# Patient Record
Sex: Female | Born: 1985 | Race: White | Hispanic: No | Marital: Single | Smoking: Former smoker
Health system: Southern US, Community
[De-identification: ages and names within clinical notes are randomized; demographics above are authoritative.]

## PROBLEM LIST (undated history)

## (undated) DIAGNOSIS — F319 Bipolar disorder, unspecified: Secondary | ICD-10-CM

## (undated) DIAGNOSIS — J45909 Unspecified asthma, uncomplicated: Secondary | ICD-10-CM

## (undated) DIAGNOSIS — J302 Other seasonal allergic rhinitis: Secondary | ICD-10-CM

## (undated) DIAGNOSIS — E282 Polycystic ovarian syndrome: Secondary | ICD-10-CM

## (undated) HISTORY — PX: WISDOM TOOTH EXTRACTION: SHX21

## (undated) HISTORY — DX: Polycystic ovarian syndrome: E28.2

---
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
## 2008-10-05 ENCOUNTER — Other Ambulatory Visit: Admission: RE | Admit: 2008-10-05 | Discharge: 2008-10-05 | Payer: Self-pay | Admitting: Family Medicine

## 2009-03-26 ENCOUNTER — Emergency Department (HOSPITAL_BASED_OUTPATIENT_CLINIC_OR_DEPARTMENT_OTHER): Admission: EM | Admit: 2009-03-26 | Discharge: 2009-03-26 | Payer: Self-pay | Admitting: Emergency Medicine

## 2009-03-26 ENCOUNTER — Ambulatory Visit: Payer: Self-pay | Admitting: Diagnostic Radiology

## 2009-06-07 ENCOUNTER — Emergency Department (HOSPITAL_COMMUNITY): Admission: EM | Admit: 2009-06-07 | Discharge: 2009-06-07 | Payer: Self-pay | Admitting: Emergency Medicine

## 2009-10-16 ENCOUNTER — Other Ambulatory Visit: Admission: RE | Admit: 2009-10-16 | Discharge: 2009-10-16 | Payer: Self-pay | Admitting: Family Medicine

## 2009-12-24 ENCOUNTER — Emergency Department (HOSPITAL_BASED_OUTPATIENT_CLINIC_OR_DEPARTMENT_OTHER): Admission: EM | Admit: 2009-12-24 | Discharge: 2009-12-24 | Payer: Self-pay | Admitting: Emergency Medicine

## 2010-11-15 ENCOUNTER — Other Ambulatory Visit
Admission: RE | Admit: 2010-11-15 | Discharge: 2010-11-15 | Payer: Self-pay | Source: Home / Self Care | Admitting: Family Medicine

## 2010-11-15 ENCOUNTER — Other Ambulatory Visit: Payer: Self-pay | Admitting: Family Medicine

## 2011-02-09 LAB — POCT PREGNANCY, URINE: Preg Test, Ur: NEGATIVE

## 2011-02-11 LAB — DIFFERENTIAL
Basophils Absolute: 0.1 10*3/uL (ref 0.0–0.1)
Basophils Relative: 2 % — ABNORMAL HIGH (ref 0–1)
Neutro Abs: 4.5 10*3/uL (ref 1.7–7.7)
Neutrophils Relative %: 71 % (ref 43–77)

## 2011-02-11 LAB — BASIC METABOLIC PANEL
CO2: 24 mEq/L (ref 19–32)
Calcium: 9.3 mg/dL (ref 8.4–10.5)
Creatinine, Ser: 0.7 mg/dL (ref 0.4–1.2)
GFR calc Af Amer: >60 mL/min (ref >60)
Glucose, Bld: 91 mg/dL (ref 70–99)

## 2011-02-11 LAB — D-DIMER, QUANTITATIVE: D-Dimer, Quant: 0.26 ug/mL-FEU (ref 0.00–0.48)

## 2011-02-11 LAB — CBC
MCHC: 33.8 g/dL (ref 30.0–36.0)
RDW: 11.9 % (ref 11.5–15.5)

## 2011-03-21 NOTE — Discharge Summary (Signed)
Summer Arnold, Summer Arnold                 ACCOUNT NO.:  [id number]   MEDICAL RECORD NO.:  [id number]          PATIENT TYPE:  INP   LOCATION:  0104                          FACILITY:  [facility]   PHYSICIAN:  Lalla Brothers, MDDATE OF BIRTH:  1986-07-15   DATE OF ADMISSION:  04/27/2005  DATE OF DISCHARGE:  05/02/2005                                 DISCHARGE SUMMARY   IDENTIFICATION:  A 25 year old female who will be a senior at [employer] this fall was admitted emergently voluntarily in transfer from  [facility] for acute inpatient psychiatric  stabilization and treatment of suicide intent, planning to overdose to kill  herself this time. She had previous suicide attempts as well as superficial  lacerations self-inflicted on the right wrist. She had been noncompliant  with the medication at least 4 days. She was planning to be impregnated by a  23 year old man she states on the Internet by meeting him April 29, 2005 for  that purpose. Although she is known to have bipolar disorder since as early  as age 9 requiring inpatient treatment for overdose at that time, she is  depressed at the time of admission. The patient feels hopeless about herself  and her future and seems to design her pregnancy more to fulfill emptiness  and failure of her own rather than out of grandiose or overly romantic  reasons. The patient has never opened up about these concerns on previous  admissions which have been numerous, with mother establishing case  management during her last hospitalization here of preferring return to  acute treatment instead of moving onto chronic treatment at the time of  referral. For full details, please see the typed admission assessment.   SYNOPSIS OF PRESENT ILLNESS:  The patient's case manager is Reatha Armour with  [employer], [phone]. She had inpatient treatment in 1999 at  age 43 for an overdose. In December2001, she was inpatient  at the [facility] for suicidal ideation and depression. She was hospitalized in  September2005, being clinically manic at that time but reporting depression  with suicidal ideation and self cutting. Her last admission was February 05, 2005 through February 13, 2005 at which time Topamax was increased and Abilify  was changed to Geodon. The patient and mother indicate that the patient has  been on the increased dose of Lamictal at150 mg daily since last  hospitalization in April of 2006. Mother was the patient off so many  medications and fears that interactions are the source of the patient's  decompensation. The patient generally maintains it is just a need for  medication adjustment while the patient seems to genuinely have  developmental and dynamic relationship concerns that she is never discussing  and never resolving. She has been involved in Internet pornography as well  as planning dangerous liaisons with convicted criminals and sex offenders by  Internet. Family has intervened twice to protect her, once planning to elope  from school and another time climbing out of the second story window of  their home. She is  under the ongoing outpatient care of Dr. Toni Arthurs and has  seen Jerral Bonito for therapy in the past. The patient is currently resistant  to talking through solutions. Her Lithobid is 1,200 mg daily in two divided  doses, Topamax 100 mg every morning, Seroquel 400 mg daily in two divided  doses, Geodon 80 mg at bedtime, Lamictal 100 mg morning and 50 mg at night,  Ambien 5 mg at bedtime if needed along with Singulair 10 mg at bedtime,  Zyrtec 10 mg bedtime, and albuterol inhaler p.r.n. Aunt and maternal great  aunt had bipolar disorder. Her weight is the same as April2006 and down from  154 pounds in September2005. She did pass the 11th grade.   MENTAL STATUS EXAM:  On admission, the patient had a paucity of spontaneous  verbal communication, particularly about  important issues and conflicts. She  was devaluing of treatment with severe dysphoria. She was withdrawn and  crying frequently. She had impulse control difficulties. She was less  passive aggressive and had enhanced maturation of identity character  structure though she did not always apply this. The patient reported that  mother aborted her suicide attempt plans and that she would have killed  herself by overdose if not for mother's intervention.   LABORATORY FINDINGS:  The patient had admission comprehensive metabolic  panel was normal with sodium 140, potassium 3.8, random glucose 89, chloride  105, CO2 28, creatinine 0.8, calcium 9.2, albumin 3.7, AST 16, ALT 15. TSH  was normal 1.710. Urine HCG was negative. Urine drug screen was negative  with creatinine of 108 mg/dL. Urinalysis was normal with specific gravity of  1.018 with a trace of protein and negative microscopic exam with a few  epithelial cells. Urine probe for gonorrhea and chlamydia trichomatous by  DNA amplification were both negative. Lithium level at the time of admission  was 0.72 mEq/L with reference range 0.8-1.4. Three days later, on the same  dose of lithium though she had been noncompliant prior to admission, the  lithium level was to 1.23 mEq/L with reference range 0.8-1.4, likely  approaching steady state then.   HOSPITAL COURSE AND TREATMENT:  General medical exam by Mallie Darting, P.A.-  C., noted the patient's perception that stepfather interrupted her suicide  plan. The patient notes that biological father might have depression. The  patient reports feeling unloved and thinking that her boyfriend will not  come to see her. She reports probable ALLERGY TO PENICILLIN. She reports  history of asthma and migraine. She has a birthmark and that is benign on  the left great toe. She had precocious menarche at age 89-1/2 and reports  that she is sexually active. She is overweight and is greatly concerned about her  weight. She is Tanner stage V and notes GYN exam last summer at  [facility] [place]. Admission height was 61 inches with  weight of 148 pounds. Blood pressure on admission was 127/90 with heart rate  of 90 sitting and 121/81 with heart rate of 95 standing. Vital signs were  normal throughout hospital stay and discharge blood pressure was 118/79 with  heart rate of 79 supine and 102/77 with heart rate of 86 standing. The  patient was resistant to therapeutic change but did comply although she  became angry and self-defeating when Topamax was completely discontinued.  Geodon was discontinued, and Ambien was made as needed with the patient  requesting it approximately 70% of the time. Lamictal was increased to 100  mg morning and bedtime which the patient discounted, stating he was making  her too sleepy though primarily she complained that the medications were  slower her down because she had restarted them after being off of them for  four days. Topamax was tapered and discontinued but restarted when she  complained bitterly that she would gain weight with her vegetative symptoms  if Topamax was not continued. She did have several migraines during the  hospital stay treated with Midrin successfully. However, she did not have  frequent migraines. Mother and the patient gradually restored communication  with the patient addressing during this admission better than in any time in  the past the dynamics of her sense of being unloved and having the need for  a relationship even with undesirable forbidden boyfriend. She initially was  angry and withdrawn as peers confronted her maladaptive choices and  behaviors, but she gradually integrated with peers in the treatment milieu  and began to look realistically at her behavior. As she did so, she and  mother improved communication and establishing safe and collaborative  directions for future. Mother was considering Depo-Provera in  the final  family therapy session. The patient had tried living with father two years  ago without success. The family reconstituted capacity to continue work on  containment with hopefully the greatest improvement coming internally for  the patient. She was discharged in improved condition. She required no  seclusion or restraint during hospital stay. She was labile during hospital  stay, but gradually her mood improved as did impulse control, and she became  more realistic in her treatment discussions so that she could benefit from  psychotherapy after discharge.   FINAL DIAGNOSIS:  AXIS I:  1.  Bipolar disorder, depressed, severe.  2.  Oppositional defiant disorder.  3.  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  Noncompliance with medication and therapy.  5.  Parent/child problem.  6.  Other specified family circumstances.  7.  Other interpersonal problem.  AXIS II:  Diagnosis deferred.  AXIS III:  1.  Lacerations. 2.  Allergic rhinitis and asthma.  3.  Noncompliance including with psychotropic medication and birth control      pill.  4.  Migraine.  5.  Myopia  6.  Overweight.  7.  Allergy to penicillin and possibly aspirin by history.  AXIS IV:  Stressors:  Family severe, acute and chronic; peer relations  severe, acute and chronic; Internet porn and chat rooms severe, acute and  chronic; phase of life severe, acute and chronic; school moderate, acute and  chronic.  AXIS V:  GAF on admission 34 with highest in last year 65 and discharge GAF  was 55.   PLAN:  The patient had more success during this hospitalization than any in  the past. Community-based aftercare was expanded upon as the patient becomes  more capable and willing to work on such. Will make Topamax p.r.n. for  vegetative impulse dyscontrol as well as making Ambien p.r.n. for sleep.  Geodon was discontinued. She is having no side effects from increased   Lamictal including skin being clear. Her lithium level became therapeutic  again inpatient. She is discharged on the following medications:  1.  Lamictal 100 mg tablet every morning and bedtime, quantity #60 with no      refill prescribed.  2.  Lithobid 300 mg tablets take 2 every morning and bedtime, quantity #120  with no refill prescribed.  3.  Seroquel 200 mg tablet every morning and 5:00 p.m., quantity #60 with no      refill prescribed.  4.  Topamax 100 mg tablet as 1/2 or 1 in the morning if needed for      vegetative impulse dyscontrol, quantity #30 with no refill prescribed.  5.  Ambien 5 mg at bedtime if needed for insomnia, quantity #30 with no      refill prescribed.  6.  Inderal 10 mg every morning and bedtime, own home supply.  7.  Zyrtec 10 mg every bedtime, own home supply.  8.  Singulair 10 mg every bedtime, own home supply.  9.  Ortho-Tri-Cyclen every bedtime, as per own home supply, with mother      planning switched to Depo-Provera.  10. Albuterol inhaler as needed, as per own home supply.   The patient follows a weight reduction, weight control diet is and is  encouraged be physically active including exercise. Crisis and safety plans  are outlined if needed. Parents wish to set up aftercare psychotherapy with  Dr. Carlus Pavlov and psychiatric follow-up with Dr. Len Blalock  themselves to coordinate times. They have  information on bipolar support groups in the community, and [place] and  [facility], and the [facility], teen mental  health issues group, scheduled the first and third Mondays at 1800 for an  hour and half at [facility].       GEJ/MEDQ  D:  05/05/2005  T:  05/06/2005  Job:  [id number]   cc:   Onalee Hua L. Toni Arthurs, M.D.  [address]  [address]  [place]  [place] [address]  Fax: [phone]   Carlus Pavlov, M.D.  [address] [place], [place] [address]
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
## 2011-03-21 NOTE — H&P (Signed)
NAMEMarland Kitchen  Summer Arnold, Summer Arnold                             ACCOUNT NO.:  [id number]   MEDICAL RECORD NO.:  [id number]                   PATIENT TYPE:  INP   LOCATION:  0105                                 FACILITY:  [facility]   PHYSICIAN:  Beverly Milch, MD                  DATE OF BIRTH:  1986-02-29   DATE OF ADMISSION:  07/11/2004  DATE OF DISCHARGE:                         PSYCHIATRIC ADMISSION ASSESSMENT   ADOLESCENT PSYCHIATRIC ADMISSION ASSESSMENT   IDENTIFICATION:  3-1/2-year-old female, 11th grade student at [employer], is admitted emergently voluntarily on referral from Dr. Len Blalock for inpatient stabilization of suicide attempt and exacerbation of  bipolar disorder and impulse control disorder.  The patient has been less  stable in her mood as the stress of starting school has occurred.  She feels  more tension and pressure to be on the Internet with a 3 year old ex-  convict and mother has discovered a plan for the conviction to pick the  patient up at the end of the school day 07/12/2004.  As mother confronted  the patient, she decompensated into despair and cut her left forearm  multiple times with glass.  She was hospitalized at the [facility] in 2001 for a Tylenol overdose suicide attempt.  For full details,  please see the typed admission assessment.   SYNOPSIS OF PRESENT ILLNESS:  The patient has a long history of bipolar  disorder and was first hospitalized in 1999, apparently at [facility].  The patient has been under the care of Dr. Len Blalock and  notes that she complies with her medication.  She states her lithium level  was therapeutic earlier this year.  She states she will always be on  Seroquel and she was on Seroquel during the last [facility] 12/01 through 10/07/2000.  At that time, she was also on Luvox and  Depakote.  The patient states she can never take Depakote because it almost  killed her  by becoming so unstable in her mood and states she knows she  cannot tolerate any antidepressants with her bipolar disorder.  Rapid  cycling was noted during that hospitalization in 2001, though she was  admitted more depressed at that time.  The patient states she is depressed  at this time of admission, though nursing observes her to be more manically  excited.  Patient truly seems to have rapid cycling at times.  Patient is  still fixated on being able to fulfill the relationship with the man on the  Internet.  She states Dr. Toni Arthurs and she have talked multiple times about  her obsessiveness about the computer.  This was noted in her chart in 2001  that she had some obsessive-compulsive concerns.  The patient does not  manifest anxiety in this regard, but rather impulse control difficulties.  She states that she develops an internal tension  and pressure to carry out  risk taking things and it becomes unbearable if she does not follow through.  She uses alcohol occasionally, but is not using alcohol or illicit drugs in  any out-of-control fashion even though there is a family history of  alcoholism in the males on both sides of the family.  There is a family  history of bipolar disorder, as well as apparently unipolar depression.  Patient notes no hyperpnea or temperature regulation difficulties.  She  denies purging.  She is somewhat overweight, but denies body image  distortion.  She denies compulsive exercising, diet pills, laxatives, but  she notes that she does drink a lot of water, though she quantifies this as  2 water bottles daily.  She does bite her nails and peels her nail folds by  picking.  She will not discuss other impulse control difficulties and seems  to hesitate to discuss eating behavioral issues.  She denies hallucinations  or delusions at the time of admission.  She has had no intercurrent organic  central nervous system trauma.  She continues to work with Dr. Len Blalock  regularly in her outpatient care.  Hospital records from her first  hospitalization at the [facility], which was either 1999 or  2000, were not immediately available.   PAST MEDICAL HISTORY:  The patient has a history of asthma and migraine,  currently managed by Dr. Sigmund Hazel at [facility] in [place].  She does have orthodontics.  Last menses was August of 2005 and  she is sexually active.  She had menarche around age 74 and menses are  regular.  She has acute lacerations on the left forearm from glass, which  were self-inflicted.  She has a birthmark on the left great toe.  She is  allergic to PENICILLIN and ASPIRIN, manifested by rash.  She has had no  seizure or syncope.  She has had no heart murmur or arrhythmia.  Her  medications at the time of admission include:  1.  Lithium carbonate 300 mg to take 2 every morning and 3 every 1630.  2.  Topamax 200 mg twice daily.  3.  Seroquel 200 mg three times daily.  4.  Zyrtec 10 mg every morning or Claritin 10 mg every morning.  5.  Singulair 10 mg every morning.  6.  Ortho Tri-Cyclen every morning, and she is on Topamax, which at high      doses may accelerate the metabolism of the BCP.  7.  Ventolin inhaler p.r.n. asthma.  8.  Midrin p.r.n. migraine.   REVIEW OF SYSTEMS:  The patient denies difficulty with gait, gaze, or  continence.  She denies exposure to communicable disease or toxins.  She  denies rash, jaundice, or purpura.  There is no chest pain, palpitations, or  presyncope.  There is no abdominal pain, nausea, vomiting, or diarrhea.  There is no dysuria or arthralgia.   IMMUNIZATIONS:  Up to date.   FAMILY HISTORY:  The patient lives with mother and stepfather.  Males on  both sides of the family have alcoholism.  There is bipolar disorder in an  aunt.  There are other aunts and a grandfather with depression.  SOCIAL AND DEVELOPMENTAL HISTORY:  The patient has had one episode of   shoplifting.  She has habitual nail biting and picking at the nail folds.  She appears to overeat somewhat.  She has used alcohol, but denies  consequences.  She is  sexually active and is currently obsessed with a female  on the Internet, who is an ex-convict.  The patient attends the 11th grade  at [employer].  The patient will not discuss other habits at this  time, if any.  She denies other drug use other than occasional alcohol.   ASSETS:  The patient seems capable for therapy, though she currently seems  to validate her approach to the computer, the Internet relationship, and the  problems with stress.   MENTAL STATUS EXAM:  Height is 61-1/2 inches and weight is 150 pounds.  Blood pressure is 124/89 with heart rate of 98 supine and standing blood  pressure is 115/85 with heart rate of 97.  Neurological exam is intact.  She  has no abnormal involuntary movements evident.  She has no soft neurologic  findings or pathologic reflexes.  Gait and gaze are intact.  Muscle strength  and tone are normal.  AMRs are normal.  She is alert and oriented with  speech intact.  Eosinophils are 7, slightly elevated with upper limit of  normal is 5, and potassium is slightly low at 3.4 with lower limit of normal  3.5.  Albumin is low at 3.2 with lower limit of normal 3.5.  Urine specific  gravity is 1.006 at midnight and, therefore, somewhat dilute, but performed  at midnight.  Lithium level is 1.3 the following morning with therapeutic  range 0.8.14.  This was performed at 0600 and likely approximately 13 hours  after dose.  She has subcuticular linear ecchymosis along the margin  vertically of the blood pressure cuff of the left arm with no other purpura  or free bleeding.  Patient is labile in mood.  She is somewhat over animated  and over activated, but intensely dysphoric with the situation of the  Internet relationship.  She is also stressed by the onset of school.  She  reports she is  depressed, but appears more manic.  She seems to have  definite rapid cycling.  She has impulse control difficulties with obsessive  and compulsive features.  She has no psychotic symptoms currently.  She has  no dissociative or posttraumatic symptoms.  She does not manifest adequate  anxiety.  She states she is risk taking and has impulse control  difficulties.  She has shoplifting once.  She has been suicidal, cutting her  left forearm and has a history of suicide attempts in the past.  She is not  homicidal or assaultive.   IMPRESSION:   AXIS I:  1.  Bipolar disorder, mixed, severe.  2.  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  3.  Rule out eating disorder not otherwise specified (provisional      diagnosis).  4.  Other interpersonal problem.  5.  Other specified family circumstances.   AXIS II:  Diagnosis deferred.   AXIS III:  1.  Asthma. 2.  Migraine.  3.  Orthodontics.  4.  Lacerations left forearm.  5.  Allergy to PENICILLIN and ASPIRIN.  6.  Slight eosinophilia.  7.  Low potassium and albumin possibly from polydipsia for water.  8.  Subcuticular contusion at the blood pressure site left arm.  9.  Oral contraceptives and Topamax.  10. Subcuticular ecchymosis at the blood pressure cuff site.   AXIS IV:  Stressors:  Internet severe, acute, and chronic; family moderate,  chronic; phase of life severe, acute; school moderate, acute, and chronic.  AXIS V:  GAF 38 with highest in the last year 65.   PLAN:  Patient is admitted for inpatient adolescent psychiatric and  multidisciplinary multimodal behavioral health treatment in a team-based  programmatic locked psychiatric unit.  Medications will be maintained and  will observe for impulse control difficulties and monitor mood as  psychotherapeutic interventions are advanced for safety as well.  Medication  adjustments can be undertaken as indicated, though currently  medication  regimen has been helpful on an outpatient basis, and the stress of the  Internet relationship, school, and the reaction of the family will be  addressed.  Cognitive behavioral therapy, family therapy, parent management  training, habit reversal, substance abuse prevention, social skills, and  identity consolidation can be addressed during the hospital stay.  Estimated  length of stay is 5-7 days with target symptoms for discharge being  stabilization of suicide risk and mood, stabilization of impulse control,  stress management, and generalization of the capacity for safe, effective  participation in outpatient treatment.                                               Beverly Milch, MD    GJ/MEDQ  D:  07/12/2004  T:  07/12/2004  Job:  [id number]

## 2011-03-21 NOTE — H&P (Signed)
[facility]  Patient:    Summer Arnold, Summer Arnold                          MRN: [id number] Adm. Date:  [id number] Attending:  Jasmine Pang CC:         Len Blalock, MD   Psychiatric Admission Assessment  DATE OF ADMISSION:  October 03, 2000.  PATIENT IDENTIFICATION:  A 25 year old Caucasian female from [place], [place] who is a Audiological scientist at [employer].  HISTORY OF PRESENT ILLNESS:  Patient has become increasingly depressed with multiple neurovegetative symptoms.  These include an increase in her appetite, anhedonia, feelings of hopelessness and worthlessness and low self esteem.  In addition, she has had suicidal ideation for the past two months.  She states this escalated to the point that she took an overdose of 30 Tylenol on the day of admission.  She admits she felt hopeless and wanted to die.  She was stabilized in the emergency room and then referred to our unit.  In addition to the depressive episode, she has rages at times, with extreme irritability.  PAST PSYCHIATRIC HISTORY:  Patient has been seen by Dr. Toni Arthurs who has diagnosed her with bipolar disorder not otherwise specified.  She is currently being treated with Depakote 250 mg p.o. b.i.d., Seroquel 50 mg p.o. t.i.d., Luvox 100 mg p.o. q.h.s.  SUBSTANCE ABUSE HISTORY:  None.  PAST MEDICAL HISTORY:  Patient has asthma.  Allergies:  Allergic to PENICILLIN (rash, shortness of breath).  She is also allergic to ASPIRIN.  Current medications as per past psychiatric history.  She is also on an albuterol inhaler for asthma.  FAMILY/SOCIAL HISTORY:  Patient lives with her mother, stepfather, and two siblings.  She spends weekends with her father.  She is in the 7th grade and well behaved in school.  There is no abuse history.  Family history is remarkable for both sides of the family having alcoholism.  Both sides of her family also have bipolar disorder and depression.  She  has no legal problems.  MENTAL STATUS EXAMINATION:  Patient presented as a friendly, cooperative Caucasian female with good eye contact.  Speech is normal rate and slow. There was some psychomotor retardation.  Mood was depressed.  Affect sad, constricted.  There was positive suicidal ideation as per history of present illness.  There was no homicidal ideation or aggression.  There was no psychosis or perceptual disturbance.  Thought processes were logical and goal directed.  Thought content revealed no predominant theme.  On cognitive exam, patient was alert and oriented to person, place and time and reason for being in the hospital.  Short term and long term memory were adequate.  General fund of knowledge were age and education level appropriate.  Attention and concentration were somewhat diminished as assessed by her difficulty concentrating.  Insight was fair, judgment poor.  ADMISSION DIAGNOSES: Axis I:    1, Bipolar disorder not otherwise specified.            2. Features of obsessive-compulsive disorder (according               to Dr. Toni Arthurs). Axis II:   Deferred. Axis III:  Asthma. Axis IV:   Severe. Axis V:    Global assessment of function is 10.  ASSETS AND STRENGTHS:  Patient is healthy.  She has a supportive family.  She is warm and engaging.  PROBLEMS:  Mood instability with  suicidal ideation.  SHORT TERM TREATMENT GOAL:  Resolution of suicidal ideation.  LONG TERM TREATMENT GOAL:  Resolution of mood instability.  INITIAL PLAN OF CARE:  Increase Seroquel to 100 mg p.o. b.i.d. with continued increases throughout hospital stay.  Increase Luvox to 150 mg p.o. q.h.s. We will get an a.m. Depakote level and most likely need to increase this to get to an appropriate therapeutic level.  Patient will also be involved in unit therapeutic groups and activities to decrease cognitive distortion and decrease suicidal ideation.  She will be involved in family  therapy.  ESTIMATED LENGTH OF STAY:  Five to seven days.  CONDITIONS NECESSARY FOR DISCHARGE:  No longer suicidal.  POST HOSPITAL CARE PLAN:  Patient will return home to live with family. Follow up therapy and medication management will be with Len Blalock, M.D. DD:  10/04/00 TD:  10/04/00 Job: [id number]

## 2011-03-21 NOTE — Discharge Summary (Signed)
[facility]  Patient:    Summer Arnold, Summer Arnold                          MRN: [id number] Adm. Date:  [id number] Disc. Date: 10/07/00 Attending:  Milford Cage H                           Discharge Summary  REASON FOR ADMISSION:  This 25 year old white female was admitted for increasing symptoms of depression and a drug overdose as a suicide attempt. For further history of present illness, please see the patients psychiatric admission assessment.  PHYSICAL EXAMINATION:  Her physical examination at the time of admission was remarkable for history of asthma.  LABORATORY EXAMINATION:  The patient underwent a laboratory work-up to rule out any medical problems contributing to her symptomatology.  A CBC with differential was significant for an eosinophil count of 15% and MCHC of 35.4, platelet count 144,000.  A metabolic panel was within normal limits.  Hepatic panel was within normal limits.  Thyroid function tests were within normal limits.  A valproic acid level on admission was 39.1.  GGT was unremarkable. A urine drug screen was unremarkable with the exception of an elevated acetaminophen level in the emergency room following the patients drug overdose where she was treated and stabilized and medically cleared. The UA was unremarkable.  Pregnancy test was negative.  The patient received no special procedures, no additional consultation.  She sustained no complications during her hospital course.  HOSPITAL COURSE:  The patient rapidly adapted to unit routine, socializing well with both patients and staff.  She was stabilized on Seroquel and Luvox. At the time of discharge the patient denies any suicidal or homicidal ideations.  She is participating in all aspects of the therapeutic treatment program.  It is felt that the patient has significant cycling in mood suggestive of bipolar disorder which currently appears to be fairly well controlled on a combination  of Seroquel and Luvox.  She has displayed no evidence of a danger to herself or others and consequently it is felt that she is ready for discharge in transition to a less restrictive alternative setting.  CONDITION ON DISCHARGE:  Improved.  DIAGNOSES ACCORDING TO DSM-4: AXIS I.   1. Bipolar disorder, mixed type, severe with mood congruent              psychosis.           2. Rule out obsessive compulsive disorder. AXIS II.  1. Rule out personality disorder not otherwise specified.           2. Rule out learning disorder not otherwise specified. AXIS III. Asthma. AXIS IV.  Severe. AXIS V.   Code 10 on admission and code 30 on discharge.  FURTHER EVALUATION AND TREATMENT RECOMMENDATIONS: 1. The patient is discharged to home. 2. The patient is discharged on an unrestricted level of activity and a    regular diet. 3. The patient is discharged on Seroquel 50 mg p.o. q.a.m. and 100 mg    q.h.s. Luvox 150 mg p.o. q.h.s. 4. The patient will follow up with her outpatient psychiatrist, Dr. Toni Arthurs,    and her individual and family therapist for all further aspects of her    mental health care.  As well, she will be referred to the IOP program    to attempt to transition her appropriately to a less restrictive  alternative setting prior to entrance back into standard outpatient    therapy.  Because of her problems with impulse control that necessitated    this admission, this appears to be indicated. The patient will follow up    with Dr. Carolanne Grumbling in the IOP program and consequently, I will sign off    on the case at this time. DD:  10/07/00 TD:  10/07/00 Job: [id number]

## 2011-03-21 NOTE — H&P (Signed)
NAMEMarland Arnold  Summer, Arnold                 ACCOUNT NO.:  [id number]   MEDICAL RECORD NO.:  [id number]          PATIENT TYPE:  INP   LOCATION:  0102                          [facility]ILITY:  [facility]   PHYSICIAN:  Lalla Brothers, MDDATE OF BIRTH:  08/01/1986   DATE OF ADMISSION:  02/05/2005  DATE OF DISCHARGE:                         PSYCHIATRIC ADMISSION ASSESSMENT   IDENTIFICATION:  This 25 year old female eleventh grade student at [employer] is admitted emergently involuntarily on a [facility]  petition for commitment in transfer from [facility]  Crisis for inpatient stabilization and treatment of suicide ideation,  planning to overdose her cut herself to die. The patient's symptoms of 3  day's duration exacerbated acutely when her attempt at eloping from home at  9:00 p.m. February 04, 2005 to join by cab the adult ex-convict to live with him  in [place] was aborted. The patient was caught as she was leaving the home  from the second story with a sheet injuring her left foot as she dropped to  the ground by bruising the bone. This adult female is the same female she was  leaving home to join when she was last hospitalized with mixed mania in  September 2005. She had recent medication changes attempting to the  structure and reduce her medications for optimizing alertness and minimizing  weight gain. Dr. Len Blalock, her outpatient psychiatrist, considered  admitting her to the hospital February 03, 2005 but the patient could  recompensate at that time. She is not currently able to contract for safety  or to resolve her current crisis and dilemma.   HISTORY OF PRESENT ILLNESS:  Mother indicates the patient's lithium has been  apparently reduced from 1200 to 900 milligrams daily in the interim since  September 2005 as she was doing better. She is also being followed by  nephrology for inability to adequately concentrate her urine during her  hospitalization of September  2005 and with borderline elevated blood  pressure. Nephrology follow-up and assessment has that has determine the  patient is safe taking lithium for now and has no evidence of lithium  associated nephropathy nor does she have other renal concentrating  difficulties. Her renal concentration returned to normal over time with no  other abnormality defined fine. Blood pressure remained borderline elevated  at times but did not require treatment. The patient has apparently been  started on Abilify in the interim but found Abilify makes her cognitively  constricted according to her outpatient therapist, Jerral Bonito, as well as  the patient complaining that it made her too sleepy. Mother states the  patient would complain of being sleepy even when she did not objectively  appear to be sleepy. Mother could not discern whether this was a therapy-  related issues or whether the patient was truly having side effects.  However, mother has gotten progressively stressed by the adjustments in the  patient's medication. Mother now feels she needs to go back to something  that has worked. Her lithium has been restored of 1200 milligrams recently.  She is on Seroquel only now and  mother acknowledges that Seroquel as been  helpful in the past but she states that Abilify was more helpful for mood  stabilization and organized thinking. The patient's Topamax has been  decreased from apparently 200 milligrams twice daily to 50 milligrams in the  morning. She has been started on Lamictal titrated up slowly to currently  100 milligrams every morning for the last week and she has another week to  go before that can be increased again. Mother has multiple ideas and fears  associated with adjusting her medication further. The patient was cutting a  couple of weeks ago and is now threatening to kill herself by cutting or by  overdose. She overdosed at age 87. The patient is at hospitalization  July 11, 2005 through  July 18, 2004 at the behavioral [facility] center  and prior to that in December 2001 and 1999 when this hospital was [facility]. Depakote was not successful in the past. She was treated with  Luvox in the past which ultimately was destabilizing and eventually the  diagnosis of bipolar disorder became clear. The patient acknowledges using  cannabis a couple of times but not consistently. She sees Dr. Len Blalock  regularly. However, the patient is currently irritable about any treatment.  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  The patient again has utilized computer inappropriately to arrange this  rendezvous.   PAST MEDICAL HISTORY:  The patient has a history of migraine, allergic  rhinitis and asthma, and chicken pox in childhood. She has ongoing  medications for such. Last menses was in March 2006 and she acknowledges  being sexually active. She is on birth control pills including Ortho Tri-  Cyclen every bedtime currently. She is on Singulair 10 milligrams every  morning, Zyrtec 10 milligrams every morning and albuterol inhaler p.r.n. as  well as Adderall 10 milligrams b.i.d.. Psychiatric medications currently  include Lithobid 600 milligrams b.i.d., Topamax 50 milligrams every morning,  Lamictal 100 milligrams every morning over the last week, and Seroquel 200  milligrams at breakfast and 4:00 p.m. and 100 milligrams at that time. The  patient has an acute contusions of the left foot. She feels that she landed on something too firm when she dropped to the ground from the  sheet coming  out of her second story window. Her nephrology follow-up has determined that  renal concentration deficit evident her last hospitalization has no  significant origin and is now resolved. She has no lithium nephropathy. Also  her blood pressure has become normal. She feels that she has lost some  weight with Topamax even though she devalues taking medication. She had a  discharge weight of 154-1/2 pounds and admission weight of 150 pounds in  September 2005. Currently her weight is 148 pounds. The patient is allergic  to penicillin and aspirin and does not clarify reaction at this time. She  had no other organic central nervous system trauma. She had no seizure or  syncope. She has had no heart murmur or arrhythmia.   DRUG ALLERGIES:  PENICILLIN and ASPIRIN but does not  clarify reaction at  this time.   REVIEW OF SYSTEMS:  The patient's only complaint regarding gait currently is  pain with putting pressure on the sole of her left foot. She denies  difficulty with gaze or continence. She has no acute head injury. She has a  loss of sensory function, no loss of memory. She has no headache at this  time. She has no rash, jaundice or purpura. She has no chest pain,  palpitations or presyncope. She has no cough or dyspnea. She has no  abdominal pain or flank pain. She has no diarrhea, dysuria or vomiting. She  has no arthralgia otherwise.   IMMUNIZATIONS:  Are up-to-date.   FAMILY HISTORY:  The patient resides with mother, stepfather and sibling.  The patient has an aunt with bipolar disorder as well as maternal great aunt  having bipolar disorder. Maternal great-grandmother had 2 suicide attempts.  There are other aunts and a grandfather with depression. Biological father  has apparently remarried. Mother appears to have some motor tics in the  face. There is a family history of alcohol abuse on both sides of the  family. There is family history of hypertension, diabetes and  cancer.   SOCIAL AND DEVELOPMENTAL HISTORY:  The patient is an eleventh grade student  at [employer]. Grades have been dropping recently. She  acknowledges using cannabis a couple of times but not regularly without  other consequences. She is sexually active. Mother indicates that family is  exhausted attempting to contain the patient's and is particular fearful of  the patient being released early and not being stable particular on  medications.   ASSETS:  The patient does see Dr. Toni Arthurs and Jerral Bonito.   MENTAL STATUS EXAM:  Height is 62 inches and weight is 148 pounds with blood  pressure 125/83, heart rate 92 sitting and 120/88 with heart rate of 105  standing. The patient is labile in her mixed manic denial and mood. Only  complaint is that she is bruised her left foot and that is painful. She is  asking for something for pain. She tends to disengage herself from consideration of other problems. She offers no opportunity or mechanism for  contracting for safety or resolving her current problems. She will not  discuss the adult female and simply states that she was leaving the home to be  with her boyfriend. Apparently she may have never met this adult female. She  will not address her activity on the computer nor other obsessive or  compulsive fixations. She will not address impulse control difficulties. She  will simply state she is trying to sleep but has difficulty sleeping. She is  irritable and disorganized. However, she does respond to assertive  definition of expectations for working in treatment to establish  organization. Mother doubts the patient will cooperate to address adolescent  development impulse control difficulty origins. Stabilization of mixed  bipolar is also essential. I will leave a message for Dr. Toni Arthurs in regard  to the on- unit intervention with mother relative to medication planning.  The patient has stated prior to admission that she would kill  herself by  overdose again or by cutting again.   IMPRESSION:   AXIS I:  1.  Bipolar disorder, mixed cough, severe to rule out psychotic features.  2.  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  3.  Identity disorder with passive aggressive features.  4.  Other interpersonal  problems.  5.  Parent-child problem.  6.  Other specified family circumstances.  7.  Rule out oppositional defiant disorder (provisional diagnosis).   AXIS II:  Diagnosis deferred.   AXIS III:  1.  Contusion left foot at the great metatarsal and metatarsophalangeal      joint.  2.  Oral contraceptives.  3.  Migraine.  4.  Allergic rhinitis and asthma.  5.  Overweight.  6.  History of transient renal concentration deficit without any evidence of      lithium nephropathy.   AXIS IV:  Stressors Internet severe acute and chronic; family moderate acute  and chronic; phase of life extreme acute and chronic; school moderate acute  and chronic.   AXIS V:  Global assessment of function 32 with highest in the last year 65.   PLAN:  The patient is admitted for inpatient adolescent psychiatric and  multidisciplinary multimodal behavioral treatment in a team-based  problematic locked psychiatric unit. We will restart Abilify at 10  milligrams at bedtime with mother noting that the dose was very slowly  increased in the past with definite sleepiness and cognitive constriction by  15 milligrams, though she tolerated 13.75 milligrams, at least for awhile.  We will decrease Seroquel to 200 milligrams morning and afternoon at 1600.  We will increase Topamax initially to 50 milligrams morning and bedtime and  continue Lamictal 100 milligrams every morning for the next week planning to  increase to 150 milligrams daily at that time. Her lithium is continued is  600 milligrams b.i.d. thought her lithium level the morning after admission is 0.62 mEq/L with reference range  0.8-1.4. Cognitive behavioral therapy,  anger management, habit reversal, communication and social skills, weight  and nutritional stabilization, bloating with chronic mental illness, and  family therapy for compliance issues role to be undertaken as appropriate in  the 7-10 day expected length stay.  Target symptoms for discharge include stabilization of suicide risk and  mood, stabilization of dangerous disruptive behavior and risk-taking,  reestablishment of communication containment with family and outpatient  treatment and generalization of the capacity for safe effective  participation and basic responsibilities.      GEJ/MEDQ  D:  02/05/2005  T:  02/05/2005  Job:  [id number]
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
## 2011-03-21 NOTE — Discharge Summary (Signed)
Summer Arnold, Summer Arnold                             ACCOUNT NO.:  [id number]   MEDICAL RECORD NO.:  [id number]                   PATIENT TYPE:  INP   LOCATION:  0105                                 FACILITY:  [facility]   PHYSICIAN:  Beverly Milch, MD                  DATE OF BIRTH:  Feb 03, 1986   DATE OF ADMISSION:  07/11/2004  DATE OF DISCHARGE:  07/18/2004                                 DISCHARGE SUMMARY   IDENTIFICATION:  This 64 1/23-year-old female, 11th grade student at  [employer], was admitted emergently voluntarily on referral from  Dr. Len Blalock for inpatient stabilization of suicide attempt and  decompensation of chronic bipolar disorder and impulse control disorder.  The patient had lacerated her left forearm multiple times with glasses as  she decompensated in mixed anger and despair over mother's successful  interruption of the patient's Internet plan to be picked up from school by  an ex-convict adult female she had contact with.  The patient had apparently  been improving and mother had relinquished more privileges to the patient  until currently.  The patient does not open up about sources of intrapsychic  pressure and conflict otherwise.  She was last hospitalized in 2001 for a  Tylenol overdose suicide attempt.  For full details, please see the typed  admission assessment.   SYNOPSIS OF THE PRESENT ILLNESS:  The patient was apparently first  hospitalized in 1999 and subsequently in December of 2001.  She had  previously been treated with Luvox and Depakote, finding Luvox destabilizing  and counter-therapeutic to the patient's bipolar disorder and Depakote  unsuccessful.  Mother notes that Lithium has been the most efficacious  aspect of the patient's treatment, though the patient states that she will  be on Seroquel for the rest of her life.  The patient has been on Seroquel  since her last hospitalization and the dose has significantly increased.  The patient  does not manifest decisive efforts herself to work out her  problems, but seems to keep going.  She denies purging, but is overweight.  She denies body image distortion.  She does acknowledge drinking two water  bottles daily and is thirsty frequently.  She had menarche at age 89.  She  has a history of migraine and asthma.  She is allergic to PENICILLIN and  ASPIRIN, manifested by rash.  She is on Tri-Sprintec birth control pills,  Singulair and Claritin, p.r.n. Ventolin and Midrin as well as her  psychotropic medications at the time of admission of Lithobid 600 mg in the  morning and 900 mg at supper, Topamax 200 mg twice daily and Seroquel 200 mg  three times daily.  The patient resides with mother and stepfather with  father being remarried as well.  There is bipolar disorder in an aunt and  there are other aunts and a grandfather with depression.  Maternal  great-  grandmother had two suicide attempts and the great maternal aunt had bipolar  disorder.  There are family members on both sides with substance abuse with  alcohol.  Maternal grandmother had hypertension and paternal uncle diabetes  with paternal grandmother having cancer.   INITIAL MENTAL STATUS EXAM:  The patient was labile in mood and manifested  significant denial and disregard for circumstances.  Her mood was often  inappropriate for circumstances.  She reports being depressed, but appeared  more manic and seemed to have rapid cycling.  She has impulse control  difficulties with obsessive compulsive features, but does not manifest  significant anxiety or even adequate anxiety.  She reports shoplifting only  once.  She had the suicide attempt with a history of the same in the past.   LABORATORY FINDINGS:  At the time of admission, the patient's Lithium level  was 1.3 the morning after admission with reference range 0.8 to 1.4 and Dr.  Toni Arthurs reports that the last outpatient Lithium level was approximately 1  mEq/L.  A  repeat Lithium level two days later without alteration in the  Lithium dose was 0.95 mEq/L.  The Lithium was then decreased to 600 mg twice  daily or a reduction of 300 mg.  Her Lithium level on the day of discharge  was 0.90 mEq/L after three days on the reduced dose.  Her serum sodium was  137 on admission, 141 at the time of the second Lithium level and 140 at  discharge.  Potassium was 3.4 and therefore low on the morning after  admission with reference range 3.5 to 5.1.  Potassium was normal at the time  of the second Lithium level at 3.8, but was low again at 3.4 on the day of  discharge.  Chloride was normal at 111 on admission and then elevated  slightly at 113 at the time of the second Lithium level and 111 at the time  of discharge with CO2 normal all three times at 24 and 25.  Glucose was  normal at 86 fasting, 91 and 86.  Creatinine was normal at 0.8 all three  times and BUN was 9, 5 and 6 respectively.  At the time of the second  Lithium level, on July 14, 2004, serum osmolality was 290 and urine  osmolality was 131.  Therefore, urine osmolality was low.  On the day of  discharge, serum osmolality was 289 and urine osmolality was 172 with  reference range 390 to 1090 mOsm/kg.  Urine specific gravity was 1.006 at  the time of admission at midnight and then was repeated July 14, 2004  at 0555 hours, remaining 1.006 with urinalysis otherwise negative, including  pH of 7 to 7.5.  Free T4 was slightly low at 0.78 with reference range 0.89  to 1.8.  TSH was normal at 0.959 with reference range 0.35 to 5.5.  Free T3  was normal at 2.4 with reference range 2.3 to 4.2.  Circulating thyroid is  thereby at the lower limit of normal on Lithium, but TSH is not at all  elevated.  CBC was normal, except 7% eosinophils with reference range 0 to  5, associated with allergic rhinitis and asthma.  White count was normal at 5,700, hemoglobin 13.8, MCV of 87 and platelet count 189,000.   Hepatic  function panel was normal, except albumin slightly low at 3.2 with reference  range 3.5 to 5.2 on the morning after admission.  AST was normal at 14 and  ALT 11 and GGT at 10.  Urine drug screen was negative with urine creatinine  only 22 mg/dL.  RPR was nonreactive.  Urine probes for gonorrhea and  Chlamydia trachomatis by DNA amplification were both negative.   HOSPITAL COURSE AND TREATMENT:  General medical exam by Vic Ripper,  P.A.C. noted menarche at age 83 or 17 with regular menses and she is  sexually active by history.  She has a benign birthmark on the left great  toe.  She is Tanner stage V and has asthma, migraine and is overweight.   Her admission weight was 150 pounds with height of 61.5 inches.  Blood  pressure 124/89 with heart rate of 98 supine and standing blood pressure  115/85 with heart rate of 97.  Discharge weight was 154.5 pounds.  Blood  pressures were more often mildly elevated than normal during the hospital  stay with a low of 115/81 with heart rate of 96 standing on July 16, 2004 and a high of 153/92 supine, July 15, 2004 and 139/99 standing on  the day of discharge.  Supine blood pressure on the day of discharge was  136/94 with heart rate of 89 and heart rate standing was 99.  Blood pressure  therefore ranged diastolic from 81 to 99, appearing to average in the 92 to  94 range.  The patient and mother concluded that this was abnormal for the  patient, as her blood pressure had always tended to be on the low side.   The patient was initially superficial with significant denial and mood  instability.  She manifested little or no remorse relative to her actions on  the Internet, though parents were highly distressed, including the father  and mother.  Over the course of hospital stay, the only change in  medications was to reduce the Lithium by 300 mg, though a p.r.n. dose of  Seroquel was required once after the patient became  significantly agitated  late afternoon after time with mother.  The patient gradually became more  honest and open about her intrapsychic conflict, particularly about family  relations.  The patient was honest and tearful as she processed fears about  mother and stepfather fighting all of the time as though they might  separate, which the patient states she could not handle again.  Parents  tended to assign copability to the treatment program or staff as these  family conflicts were mobilized, but all were able to work through genuine  understanding of the tension and pressure the patient experiences  intrapsychically that contributes to her impulse control difficulties.  The  patient was able to disengage from the use of the Internet and the Internet  dangerous female during the course of the hospital stay.  She became receptive  to parental monitoring and restriction.  Family concluded that father worries significantly about Lithium affecting the kidney while mother notes  that Lithium has been the most efficacious agent for the patient's bipolar  and that the patient's safety and quality of life will be significantly  impaired if Lithium is stopped.  Mother and Dr. Toni Arthurs did discuss Abilify  as another option.  There is no evidence of systemic consequence for any  renal dysfunction.  The renal concentrating ability is reduced.  The patient  appeared to have an average intake of water and an average urinary volume  during the hospital stay, but such was not quantitated.  The family is  comfortable at the time of discharge  with the patient remaining upon Lithium  for now, though nephrology consultation is discussed with family and Dr.  Toni Arthurs relative to clarification of any other significant factors,  particularly considering the patient's slight elevation in blood pressure.  Whether other renal studies, such as a renogram, etc., might be necessary  for clarification would be deferred to  medical consultation.  The patient's  free T4 is somewhat low without apparent systemic consequence, though her  weight does tend to be chronically elevated.  These factors will be  monitored and the patient may be making progress now that would allow  reduction in overall medications over time.  The patient has no suicide  ideation at the time of discharge and she was much improved.  She was  motivated to sustain her improvement outside of the hospital.  She required  no restraint or seclusion or equivalent of such during her hospital stay, as  documented at the request of nursing administration.   FINAL DIAGNOSES:   AXIS I:  1.  Bipolar disorder, mixed, severe.  2.  Impulse control disorder, not otherwise specified, with obsessive-      compulsive features.  3.  Other interpersonal problem.  4.  Parent-child problem.  5.  Other specified family circumstances.   AXIS II:  Diagnosis deferred.   AXIS III:  1.  Reduced renal concentrating ability.  2.  Intermittent mild hypokalemia.  3.  Mild hypoalbuminemia.  4.  Low free T4 on Lithium with otherwise intact thyroid axis.  5.  Allergic rhinitis and asthma.  6.  Migraine.  7.  Orthodontics.  8.  Lacerations of the left forearm.  9.  Allergy to PENICILLIN and ASPIRIN.  10. Oral contraceptives and Topamax.  11. Borderline elevated blood pressure.   AXIS IV:  Stressors:  Internet - Severe, acute and chronic; family -  moderate to severe, chronic; phase of life - severe, acute; school -  moderate, acute and chronic.   AXIS V:  Global assessment of functioning on admission was 38 with highest  in the last year of 65 and discharge global assessment of functioning was  56.   PLAN:  The patient was discharged to parents in improved condition.  She is  free of suicide ideation and tolerating medications, though with monitoring  of the Lithium, Topamax and Seroquel over time to be necessary relative to renal concentrating ability,  electrolytes, weight, thyroid status and blood  pressure.  Birth control pill may also need to be reviewed in this regard.   She is discharged home on the following medications:  1.  Lithobid 300 mg tablets to take two b.i.d., quantity No. 120 with no      refill prescribed.  2.  Topamax 200 mg tablets b.i.d., quantity No. 60 with no refill      prescribed.  3.  Seroquel 200 mg tablet t.i.d., quantity No. 90 with no refill      prescribed.  4.  Singulair 10 mg nightly, having her own home supply.  5.  Claritin or Zyrtec 10 mg every morning, having her own home supply.  6.  Tri-Sprintec birth control pill every bedtime, having her own home      supply.  7.  Ventolin inhaler as directed for asthma, own home supply.  8.  Midrin, as needed for migraine, as directed, own home supply.   We discussed with [facility] the possibility of nephrology  consultation relative to medical clarification of renal status for  monitoring over  time while on Lithium and they are checking with Dr. Fayrene Fearing  Deterding the possibility of this adolescent being seen in their practice.  Otherwise, pediatric nephrology consultation outpatient may have to be  sought at [facility] in [place] or [facility].  The patient  will see Dr. Toni Arthurs in psychiatric follow-up with mother needing to call to  set up the appointment.  The patient and parents will work with Jerral Bonito  at [facility] September 19th at 1400 for family therapy.  Crisis and safety plans are outlined if needed.  Lithium related diet with  adequate, but not excessive or inadequate, water is continued and there are  no restrictions on physical activity.  A copy of all laboratory tests as  well as all blood pressures from the hospital stay are sent with the patient  and parents.   There is a signed release on the chart for these courtesy copies.                                               Beverly Milch, MD    GJ/MEDQ  D:  07/19/2004  T:  07/19/2004  Job:  [id number]   cc:   Onalee Hua L. Toni Arthurs, M.D.  [address]  [address]  [place]  [place] [address]  Fax: [phone]   Jerral Bonito  [facility]  [address]  [place]  [place] [address]  [phone]

## 2011-03-26 ENCOUNTER — Inpatient Hospital Stay (HOSPITAL_COMMUNITY)
Admission: EM | Admit: 2011-03-26 | Discharge: 2011-04-03 | DRG: 917 | Disposition: A | Discharge status: Other Acute Inpatient Hospital | Payer: 59 | Attending: Internal Medicine | Admitting: Internal Medicine

## 2011-03-26 DIAGNOSIS — G929 Unspecified toxic encephalopathy: Secondary | ICD-10-CM | POA: Diagnosis present

## 2011-03-26 DIAGNOSIS — Z91199 Patient's noncompliance with other medical treatment and regimen due to unspecified reason: Secondary | ICD-10-CM

## 2011-03-26 DIAGNOSIS — T43501A Poisoning by unspecified antipsychotics and neuroleptics, accidental (unintentional), initial encounter: Principal | ICD-10-CM | POA: Diagnosis present

## 2011-03-26 DIAGNOSIS — F319 Bipolar disorder, unspecified: Secondary | ICD-10-CM | POA: Diagnosis present

## 2011-03-26 DIAGNOSIS — T43502A Poisoning by unspecified antipsychotics and neuroleptics, intentional self-harm, initial encounter: Secondary | ICD-10-CM | POA: Diagnosis present

## 2011-03-26 DIAGNOSIS — E669 Obesity, unspecified: Secondary | ICD-10-CM | POA: Diagnosis present

## 2011-03-26 DIAGNOSIS — D696 Thrombocytopenia, unspecified: Secondary | ICD-10-CM | POA: Diagnosis present

## 2011-03-26 DIAGNOSIS — G92 Toxic encephalopathy: Secondary | ICD-10-CM | POA: Diagnosis present

## 2011-03-26 DIAGNOSIS — D751 Secondary polycythemia: Secondary | ICD-10-CM | POA: Diagnosis present

## 2011-03-26 DIAGNOSIS — F17200 Nicotine dependence, unspecified, uncomplicated: Secondary | ICD-10-CM | POA: Diagnosis present

## 2011-03-26 DIAGNOSIS — Y92009 Unspecified place in unspecified non-institutional (private) residence as the place of occurrence of the external cause: Secondary | ICD-10-CM

## 2011-03-26 DIAGNOSIS — Z9119 Patient's noncompliance with other medical treatment and regimen: Secondary | ICD-10-CM

## 2011-03-26 DIAGNOSIS — R7309 Other abnormal glucose: Secondary | ICD-10-CM | POA: Diagnosis present

## 2011-03-26 LAB — URINALYSIS, ROUTINE W REFLEX MICROSCOPIC
Bilirubin Urine: NEGATIVE
Glucose, UA: NEGATIVE mg/dL
Hgb urine dipstick: NEGATIVE
Specific Gravity, Urine: 1.019 (ref 1.005–1.030)
pH: 6.5 (ref 5.0–8.0)

## 2011-03-26 LAB — COMPREHENSIVE METABOLIC PANEL
AST: 17 U/L (ref 0–37)
CO2: 22 mEq/L (ref 19–32)
Calcium: 8.5 mg/dL (ref 8.4–10.5)
Creatinine, Ser: 0.64 mg/dL (ref 0.4–1.2)
GFR calc Af Amer: >60 mL/min (ref >60)
GFR calc non Af Amer: >60 mL/min (ref >60)

## 2011-03-26 LAB — GLUCOSE, CAPILLARY: Glucose-Capillary: 109 mg/dL — ABNORMAL HIGH (ref 70–99)

## 2011-03-26 LAB — DIFFERENTIAL
Basophils Absolute: 0 10*3/uL (ref 0.0–0.1)
Basophils Relative: 0 % (ref 0–1)
Neutro Abs: 6 10*3/uL (ref 1.7–7.7)
Neutrophils Relative %: 80 % — ABNORMAL HIGH (ref 43–77)

## 2011-03-26 LAB — LITHIUM LEVEL: Lithium Lvl: <0.25 mEq/L — ABNORMAL LOW (ref 0.80–1.40)

## 2011-03-26 LAB — MRSA PCR SCREENING: MRSA by PCR: POSITIVE — AB

## 2011-03-26 LAB — CBC
Hemoglobin: 14.8 g/dL (ref 12.0–15.0)
MCHC: 33.6 g/dL (ref 30.0–36.0)
RBC: 5.12 MIL/uL — ABNORMAL HIGH (ref 3.87–5.11)

## 2011-03-26 LAB — RAPID URINE DRUG SCREEN, HOSP PERFORMED
Amphetamines: NOT DETECTED
Barbiturates: NOT DETECTED
Opiates: NOT DETECTED

## 2011-03-26 LAB — ETHANOL: Alcohol, Ethyl (B): <11 mg/dL — ABNORMAL HIGH (ref 0–10)

## 2011-03-26 LAB — SALICYLATE LEVEL: Salicylate Lvl: <2 mg/dL — ABNORMAL LOW (ref 2.8–20.0)

## 2011-03-26 LAB — ACETAMINOPHEN LEVEL: Acetaminophen (Tylenol), Serum: <15 ug/mL (ref 10–30)

## 2011-03-26 LAB — URINE MICROSCOPIC-ADD ON

## 2011-03-27 ENCOUNTER — Inpatient Hospital Stay (HOSPITAL_COMMUNITY): Payer: 59

## 2011-03-27 DIAGNOSIS — F39 Unspecified mood [affective] disorder: Secondary | ICD-10-CM

## 2011-03-27 LAB — URINE CULTURE
Colony Count: NO GROWTH
Culture: NO GROWTH

## 2011-03-27 LAB — MAGNESIUM: Magnesium: 2.3 mg/dL (ref 1.5–2.5)

## 2011-03-27 LAB — COMPREHENSIVE METABOLIC PANEL
AST: 15 U/L (ref 0–37)
Albumin: 3.9 g/dL (ref 3.5–5.2)
BUN: 7 mg/dL (ref 6–23)
Calcium: 8.4 mg/dL (ref 8.4–10.5)
Creatinine, Ser: 0.59 mg/dL (ref 0.4–1.2)
GFR calc Af Amer: >60 mL/min (ref >60)
GFR calc non Af Amer: >60 mL/min (ref >60)

## 2011-03-27 LAB — CBC
MCH: 28.7 pg (ref 26.0–34.0)
MCHC: 32.9 g/dL (ref 30.0–36.0)
MCV: 87.2 fL (ref 78.0–100.0)
Platelets: 148 10*3/uL — ABNORMAL LOW (ref 150–400)

## 2011-03-27 NOTE — Consult Note (Signed)
NAME:  Summer Arnold, Summer Arnold           ACCOUNT NO.:  [id number]  MEDICAL RECORD NO.:  [id number]           PATIENT TYPE:  I  LOCATION:  1223                         FACILITY:  [facility]  PHYSICIAN:  Eulogio Ditch, MD DATE OF BIRTH:  1986/09/17  DATE OF CONSULTATION:  03/27/2011 DATE OF DISCHARGE:                                CONSULTATION   REASON FOR CONSULTATION:  Overdose.  HISTORY OF PRESENT ILLNESS:  24 year old Caucasian female who was admitted on into the ICU in [place] after she overdosed on multiple medications.  The patient reported that she was feeling depressed and she does not know why she took so many medications to kill herself.  She did not plan for it but it was an impulsive act.  The patient has a history of bipolar disorder.  No position-defined disorder.  The patient has been admitted multiple times in the past at [facility].  The patient's mom reported that she is noncompliant with her followups and noncompliant with her medications.  The patient has been dating a convict by the name of Demetrio Lapping and has been living with him in the hotel.  The patient is in third year of college.  Reported that she is doing fine in studies.  The patient was very irritable during the interview, at certain times seems to have thought blocking and was confused, was not providing a logical information.  The patient denied hearing any voices.  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  FAMILY PSYCH HISTORY:  The patient grandmother has a history of multiple suicide attempts and depression runs through the family.  SUBSTANCE ABUSE HISTORY:  The patient denies abusing any drugs but urine drug screen was positive for marijuana.  ALLERGIES:  The patient is allergic to PENICILLIN and ASPIRIN.  MENTAL STATUS EXAM:  The patient was irritable, uncooperative during the interview, at certain times confused and  internally preoccupied.  The patient was also upset with the mom and was yelling on the mom when we were discussing about her issues.  At certain time during the interview, the patient said why you are asking me so many questions, was not able to provide good logical information.  Denies hearing any voices.  Denies current suicidal ideations but admitted because of recent suicide attempt by overdose.  The patient is alert, awake, oriented x3.  Memory, immediate, recent, remote poor.  Attention and concentration poor. Abstraction ability poor.  Insight and judgment poor.  DIAGNOSES:  AXIS I:  Mood disorder, NOS, rule out bipolar disorder. AXIS II:  Deferred. 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 AXIS IV:  Chronic mental health issues. AXIS V:  30.  RECOMMENDATIONS: 1. By keeping all the factors in mind that she has been admitted     multiple times in the past, noncompliant with her medications, lot     of mental health issues, has kidney impairment because of lithium,     the patient is a good candidate for long-term stabilization and the     patient's mother also requested long-term stabilization, so the  patient will be referred to a [facility]. 2. While on the medical floor, I will follow up on the patient.  At     this time, I will not start any medications because of the     overdose.  Thanks for involving me in taking care of this patient.     Eulogio Ditch, MD     SA/MEDQ  D:  03/27/2011  T:  03/27/2011  Job:  [id number]  Electronically Signed by Eulogio Ditch  on 03/27/2011 06:45:38 PM

## 2011-03-28 ENCOUNTER — Inpatient Hospital Stay (HOSPITAL_COMMUNITY): Payer: 59

## 2011-03-28 LAB — CBC
Platelets: 143 10*3/uL — ABNORMAL LOW (ref 150–400)
RBC: 4.98 MIL/uL (ref 3.87–5.11)
WBC: 7.3 10*3/uL (ref 4.0–10.5)

## 2011-03-28 LAB — COMPREHENSIVE METABOLIC PANEL
AST: 13 U/L (ref 0–37)
Albumin: 3.5 g/dL (ref 3.5–5.2)
Alkaline Phosphatase: 67 U/L (ref 39–117)
Chloride: 105 mEq/L (ref 96–112)
Creatinine, Ser: 0.64 mg/dL (ref 0.4–1.2)
GFR calc Af Amer: >60 mL/min (ref >60)
Potassium: 3.9 mEq/L (ref 3.5–5.1)
Total Bilirubin: 0.3 mg/dL (ref 0.3–1.2)
Total Protein: 6.4 g/dL (ref 6.0–8.3)

## 2011-03-29 DIAGNOSIS — F319 Bipolar disorder, unspecified: Secondary | ICD-10-CM

## 2011-03-29 LAB — COMPREHENSIVE METABOLIC PANEL
AST: 12 U/L (ref 0–37)
BUN: 9 mg/dL (ref 6–23)
CO2: 25 mEq/L (ref 19–32)
Calcium: 8.5 mg/dL (ref 8.4–10.5)
Creatinine, Ser: 0.66 mg/dL (ref 0.4–1.2)
GFR calc Af Amer: >60 mL/min (ref >60)
GFR calc non Af Amer: >60 mL/min (ref >60)

## 2011-03-29 LAB — CBC
Hemoglobin: 15.2 g/dL — ABNORMAL HIGH (ref 12.0–15.0)
MCH: 28.7 pg (ref 26.0–34.0)
MCHC: 33.5 g/dL (ref 30.0–36.0)
MCV: 85.8 fL (ref 78.0–100.0)

## 2011-03-30 LAB — CBC
HCT: 47.6 % — ABNORMAL HIGH (ref 36.0–46.0)
Hemoglobin: 16.1 g/dL — ABNORMAL HIGH (ref 12.0–15.0)
MCHC: 33.8 g/dL (ref 30.0–36.0)
RBC: 5.59 MIL/uL — ABNORMAL HIGH (ref 3.87–5.11)
WBC: 7 10*3/uL (ref 4.0–10.5)

## 2011-03-30 LAB — BASIC METABOLIC PANEL
CO2: 22 mEq/L (ref 19–32)
Chloride: 105 mEq/L (ref 96–112)
GFR calc Af Amer: >60 mL/min (ref >60)
Glucose, Bld: 80 mg/dL (ref 70–99)
Potassium: 4.2 mEq/L (ref 3.5–5.1)
Sodium: 139 mEq/L (ref 135–145)

## 2011-03-31 LAB — BASIC METABOLIC PANEL
CO2: 24 mEq/L (ref 19–32)
Calcium: 9.1 mg/dL (ref 8.4–10.5)
Chloride: 107 mEq/L (ref 96–112)
Creatinine, Ser: 0.69 mg/dL (ref 0.4–1.2)
Glucose, Bld: 87 mg/dL (ref 70–99)

## 2011-03-31 LAB — CBC
HCT: 46.7 % — ABNORMAL HIGH (ref 36.0–46.0)
Hemoglobin: 15.6 g/dL — ABNORMAL HIGH (ref 12.0–15.0)
MCH: 28.5 pg (ref 26.0–34.0)
MCHC: 33.4 g/dL (ref 30.0–36.0)
MCV: 85.4 fL (ref 78.0–100.0)
RBC: 5.47 MIL/uL — ABNORMAL HIGH (ref 3.87–5.11)

## 2011-04-03 DIAGNOSIS — F331 Major depressive disorder, recurrent, moderate: Secondary | ICD-10-CM

## 2011-04-03 NOTE — Discharge Summary (Addendum)
NAME:  Summer Arnold, Summer Arnold           ACCOUNT NO.:  [id number]  MEDICAL RECORD NO.:  [id number]           Arnold TYPE:  I  LOCATION:  1430                         FACILITY:  [facility]  PHYSICIAN:  Andreas Blower, MD       DATE OF BIRTH:  Jun 22, 1986  DATE OF ADMISSION:  03/26/2011 DATE OF DISCHARGE:                              DISCHARGE SUMMARY   PRIMARY CARE PROVIDER:  Unknown.  PSYCHIATRIST:  Dr. Shawnee Knapp, at [facility].  DISCHARGE DIAGNOSES: 1. Overdose/suicide attempt. 2. Toxic encephalopathy secondary to overdose. 3. Hyperglycemia, resolved. 4. Thrombocytopenia, resolved. 5. Bipolar disorder.  DIAGNOSTIC LABORATORY DATA:  WBC 7.5, hemoglobin 14.8, hematocrit 44.0, platelets 132, neutrophils 80%, absolute neutrophils 6.0.  Sodium 142, potassium 3.2, chloride 108, CO2 of 22, BUN 11, creatinine 0.64, glucose 129.  Acetaminophen level less than 15.0.  Urine pregnancy test is negative.  Alcohol level less than 11.  Salicylate level less than 2.0. Urine drug screen positive for tetrahydrocannabinol.  Urinalysis with small leukocytes, trace ketones, few bacteria, and 7 to 10 WBCs. Lithium level was less than 0.25.  MRSA PCR screening is positive. Magnesium is 2.3.  Urine culture shows no growth.  DIAGNOSTIC IMAGING:  CT of Summer head without contrast yields no acute intracranial findings.  Chest x-ray on May 25th yields no active disease.  CONSULTATIONS:  Eulogio Ditch, MD from Psychiatry on May 24th.  He recommended given Summer Arnold's history that she would be a good candidate for long-term stabilization, so Summer Arnold is being referred to [facility].  Her psych medications were held secondary to an overdose.  PROCEDURES:  None.  BRIEF HISTORY:  Summer Arnold is a 25 year old female with a history of bipolar disorder with multiple psychiatric admissions and visits to [facility], who presented to [facility] Summer evening of May 23rd.  Her mother  indicates that Summer Arnold took an unknown amount of Seroquel earlier that day.  Summer mother reports Summer paramedics were not called until around 4:30 this afternoon.  Mother also reports that over Summer past several weeks, Summer Arnold had not been going to her doctors appointments or checking in with her mother.  She was also dating a man of questionable character and has been living in a hotel with him.  Summer mother actually reported her daughter missing earlier this week with Summer police department..  Summer Arnold checked in with Summer mother 1 day prior to admission.  Summer boyfriend did not report Summer overdose until late this afternoon.  Evidently, Summer Arnold became very lethargic, so was brought to Summer emergency room.  According to Summer emergency room records, she was able to answer questions and indicated that she took several Seroquel.  She states that she took Summer medication because she could.  She did not give any other reason.  She did not admit to suicide attempts.  No other history was obtainable from Summer Arnold.  Summer Arnold did respond to painful stimuli and was otherwise aggressive and combative.  Hospitalists were asked to admit for further evaluation and treatment.  HOSPITAL COURSE BY PROBLEM: 1. Suicide/overdose.  Summer Arnold was admitted to  step-down unit for     very close monitoring.  She was given IV fluids and provided with a     one-to-one sitter.  She responded favorably to this treatment.  On     May 24th, she was transferred to a telemetry unit where she     continued to have her one-to-one sitter for suicide precautions.     At Summer time of discharge, her lithium levels were less than 0.25. 2. Toxic encephalopathy secondary to number suicide/overdose.     Resolved. 3. Hyperglycemia, probably secondary to medications.  Resolved     quickly.  At Summer time of discharge, Summer Arnold's glucose is 87. 4. Thrombocytopenia, resolved. 5. Bipolar disorder.  Initially, Summer  Arnold's lithium and Seroquel     were held.  These medications were restarted on May 30th.  Summer     Arnold was evaluated by Dr. Rogers Blocker of Psychiatry.  She is     awaiting a bed at [facility]. 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 Hemoglobin stable. 7. Tobacco use. Encouraged cessation.  MEDICATIONS: 1. Lithium carbonate 300 mg 3 tablets p.o. daily at bedtime. 2. Seroquel XR 150 mg 3 tablets p.o., to be taken 4 hours prior to     bedtime.  PHYSICAL EXAMINATION:  Documented in chart on rounding note dated Apr 02, 2011.  DISPOSITION:  Summer Arnold is medically stable and ready to be transferred to inpatient psych facility at [facility].  ACTIVITY:  Up ad lib.  Recommend one-to-one sitter.  DIET:  Regular diet.  Time spent on this dictation 40 minutes.  Addendum on 04/03/2011: Psych medications changed by psych and new medication list: 1. Hydroxyzine 100 mg po qhs. 2. Lamictal 25 mg po bid. 3. Lithium 600 mg po qhs. 4. Risperidone 2 mg po qhs. Discontinued seroquel.   Gwenyth Bender, NP   ______________________________ Andreas Blower, MD    KMB/MEDQ  D:  04/02/2011  T:  04/02/2011  Job:  [id number]  cc:   Dr. Shawnee Knapp  Electronically Signed by Wardell Heath REDDY  on 04/03/2011 10:53:41 AM Electronically Signed by Toya Smothers  on 04/23/2011 07:31:35 AM

## 2011-04-30 NOTE — H&P (Signed)
NAME:  Summer Arnold, Summer Arnold           ACCOUNT NO.:  [id number]  MEDICAL RECORD NO.:  [id number]           PATIENT TYPE:  LOCATION:                                 FACILITY:  PHYSICIAN:  Mauro Kaufmann, MD         DATE OF BIRTH:  Dec 05, 1985  DATE OF ADMISSION: DATE OF DISCHARGE:                             HISTORY & PHYSICAL   PRIMARY CARE PHYSICIAN:  Unknown.  PSYCHIATRIST:  Dr. Shawnee Knapp at [facility].  CHIEF COMPLAINT:  Overdose.  HISTORY OF PRESENT ILLNESS:  This is obtained from the patient's family, her mother and her sister.  The patient's mother name is Luz Lex, cell phone number [phone], home number is [phone].  Apparently some time this morning, late morning around 11 a.m., the patient took an unknown amount of Seroquel.  It has questioned that the patient may have taken some of her lithium as well.  The paramedics were not called until around 4:30 p.m. this afternoon.  The patient's mother stated that her daughter moved out several months ago and was doing quite well.  She has a longstanding history of bipolar disorder with multiple psychiatric admissions and visits to [facility].  Her first being at 25 years old with a suicide attempt.  She was seen subsequently after that at the age of 25 and again the age of 25, and then the last being at the age of 25.  She has been on lithium, Seroquel, and Lamictal.  She has been treated for bipolar disorder and oppositional defiant disorder.  For the past several months, she has been doing quite well and functioning well in society, was going to college and being on her own; however, for the past several weeks, she has not been going to the doctor's office and checking them with her mother regularly.  She has been dating a convict boyfriend by the name of [employer].  Since then, she has been living in a hotel with him.  Her mother actually reported her daughter missing earlier this week with the police  department.  Her daughter checked in with her 1 day ago.  The boyfriend did not report the overdose until later this afternoon because he is in trouble with law enforcement. Apparently, the patient became unarousable, very lethargic.  Per ER records, she was brought to the emergency department around 5:30 this afternoon and was able to answer the questions of taking several Seroquel.  She states that she took the medication because she could. Otherwise, no other history was obtainable from the patient.  The patient does respond to painful stimuli and she is otherwise aggressive when you try to awake her.  PAST MEDICAL HISTORY: 1. Bipolar disorder. 2. Oppositional defiant disorder. 3. Seasonal allergies. 4. Obesity. 5. 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 6. History of migraines.  PAST SURGICAL HISTORY:  Unknown.  FAMILY HISTORY:  Depression throughout her family.  She has a great grandmother who has had multiple suicide attempts.  SOCIAL HISTORY:  Mother denies that the patient has any significant history for substance abuse.  Urine drug screen does come back positive with marijuana being positive.  There is no alcohol in the patient's system.  However, upon further evaluation of her e-chart record, there does not appear to be any substance abuse history.  The patient does live with her boyfriend in and out of hotels, but otherwise homeless at this time.  MEDICATIONS:  Please see med reconciliation.  ALLERGIES: 1. PENICILLIN causes rash and dyspnea. 2. ASPIRIN.  REVIEW OF SYSTEMS:  Unobtainable by the patient secondary to level of consciousness.  PHYSICAL EXAMINATION:  GENERAL:  This is a somnolent, 25, obese female, in no apparent distress. VITAL SIGNS:  Blood pressure 125/80, heart rate 128, respirations 18, temperature 98.0 degrees Fahrenheit taken orally. SKIN:  Appears to be intact.  She has some mild bruising on her left upper thigh. HEENT:  Head normocephalic,  atraumatic.  Pupils equal, round, reactive to light.  Ears normal.  Nose normal.  Trachea midline. NECK:  Supple.  No JVD appreciated. CARDIOVASCULAR:  Normal S1-S2, mildly tachycardic. LUNGS:  Clear to auscultation bilaterally. ABDOMEN:  Protuberant.  No organomegaly appreciated.  Normoactive bowel sounds. GU:  Deferred.  Foley catheter was in place. MUSCULOSKELETAL:  No muscle atrophy appreciated.  She has full range of motion of her joints.  During physical examination, the patient does become combative and aggressive. VASCULAR:  She has +2 peripheral pulses appreciated. LYMPHATIC:  No lymphadenopathy. NEUROLOGIC:  The patient is quite stuporous.  She does respond to painful stimuli.  She has full strength of her upper and lower extremities.  Otherwise, neurologic examination was unable to be fully obtained.  LABORATORY DATA:  EKG shows prolonged QTC interval and sinus tachycardia at 128 beats per minute.  Lithium level was undetectable.  Her urinalysis shows positive epithelial cells and small amount of bacteria, otherwise unremarkable.  Urine drug screen shows positive THC, otherwise negative.  Negative alcohol level.  Negative salicylate level.  Her APAP levels were negative as well.  White blood cell count 7.5, hemoglobin 14.8, hematocrit 44.0, platelets 132,000.  Sodium 142, potassium 3.2, chloride 108, bicarb 22, BUN 11, creatinine 0.64, glucose 129.  ASSESSMENT AND PLAN: 1. Seroquel overdose.  The patient had an unknown amount of Seroquel     overdose.  She had a large bag of prescribed Seroquel from her     physician.  This was given back to her mother.  Poison Control was     contacted from the emergency department provider.  With a     questionable lithium overdose, they recommended to repeat an APAP     and salicylate levels and follow her LFTs.  Also, keep strict I and     O's in the patient.  Foley catheter was placed in the ER.  I will     repeat these levels in 4  hours as recommended by [employer].     However, I do not suspect that the patient had a lithium overdose     as her lithium levels were undetectable.  I suspect the patient     came of with her lithium and Lamictal in a hypomanic or manic     state.  This does appear to be her method of decompensation.  After     review of her records, she does appear to decompensate quickly when     in relation to love interest as she did around the ages of between     32-78 years of age.  The patient will be placed in the step-down     unit on suicide precautions with  a one-to-one Comptroller.  I will treat     her aggression with Ativan as needed.  However, I will hold on     ordering this until the patient wakes up further.  Psych will be     need to be consulted in the morning.  I will consult social work.     The patient's mother has tried multiple times to get the patient     for a longer stay in inpatient psychiatric rehab.  However, the     patient has been denied this for multiple different reasons.  The     patient has tried outpatient intense rehabilitation.  She has been     in [facility] multiple times.  The patient's mother is at     this point taking a seeking guardianship of the patient and would     like further information on this.  The patient's mother does not     feel that the patient is stable enough to make appropriate     decisions for herself.  I do not feel the patient at this point     will try to leave against her will secondary to being still     lethargic, however, I do not feel the patient when she does awake     she will leave the hospital without further psychiatric evaluation.     Please contact the patient's mother, Luz Lex if the patient     does decide that she wants to leave, her cell phone number is     listed earlier, but again it is [phone], her home number is     [phone].  The mother also wishes that the boyfriend [place] does  not see the patient while she is here in the hospital,     so the patient relisted as a XXX. 2. Acute Delirum.  This is likely related to #1.  I will watch     this closely.  I do not recommend that there needs to be any     imaging at this point.  However, if she does not respond to fluids     and waiting for the medication to clear system, I will obtain an     ABG and CT of her head.  She is currently maintaining her ABCs.  I     will make step-down unit to keep the patient n.p.o. until she wakes     up further. 3. Hyperglycemia.  We will continue to watch this closely as Seroquel     can lead to hyperglycemia and onset of type 2 diabetes.  I will see     hemoglobin A1c in the morning. 4. Migraines.  Apparently, she has a history of this.  It appears that     she has been on medication for this in the past, but I do not see     where she is on medication for this currently.  We will watch this     closely. 5. Obesity.  When the patient awakes and is able to eat, I do     recommend that she have nutrition counseling. 6. The patient's VTE prophylaxis will be heparin. 7. The patient is a full code.  The patient was seen and examined and the patient's care was discussed with Dr. Mauro Kaufmann.    ______________________________ Arlyn Leak, PA-C  ______________________________ Mauro Kaufmann, MD    JH/MEDQ  D:  03/26/2011  T:  03/26/2011  Job:  [id number]  cc:   Dr. Renelda Loma  Electronically Signed by Arlyn Leak PA on 04/15/2011 12:09:20 AM Electronically Signed by Mauro Kaufmann  on 04/30/2011 12:22:30 PM

## 2011-08-09 IMAGING — CR DG CHEST 1V PORT
1 series · 1 of 1 positions shown · non-contrast
Comparison: 06/07/2009

CLINICAL DATA: Respiratory distress.

PORTABLE CHEST - 1 VIEW

[AP]
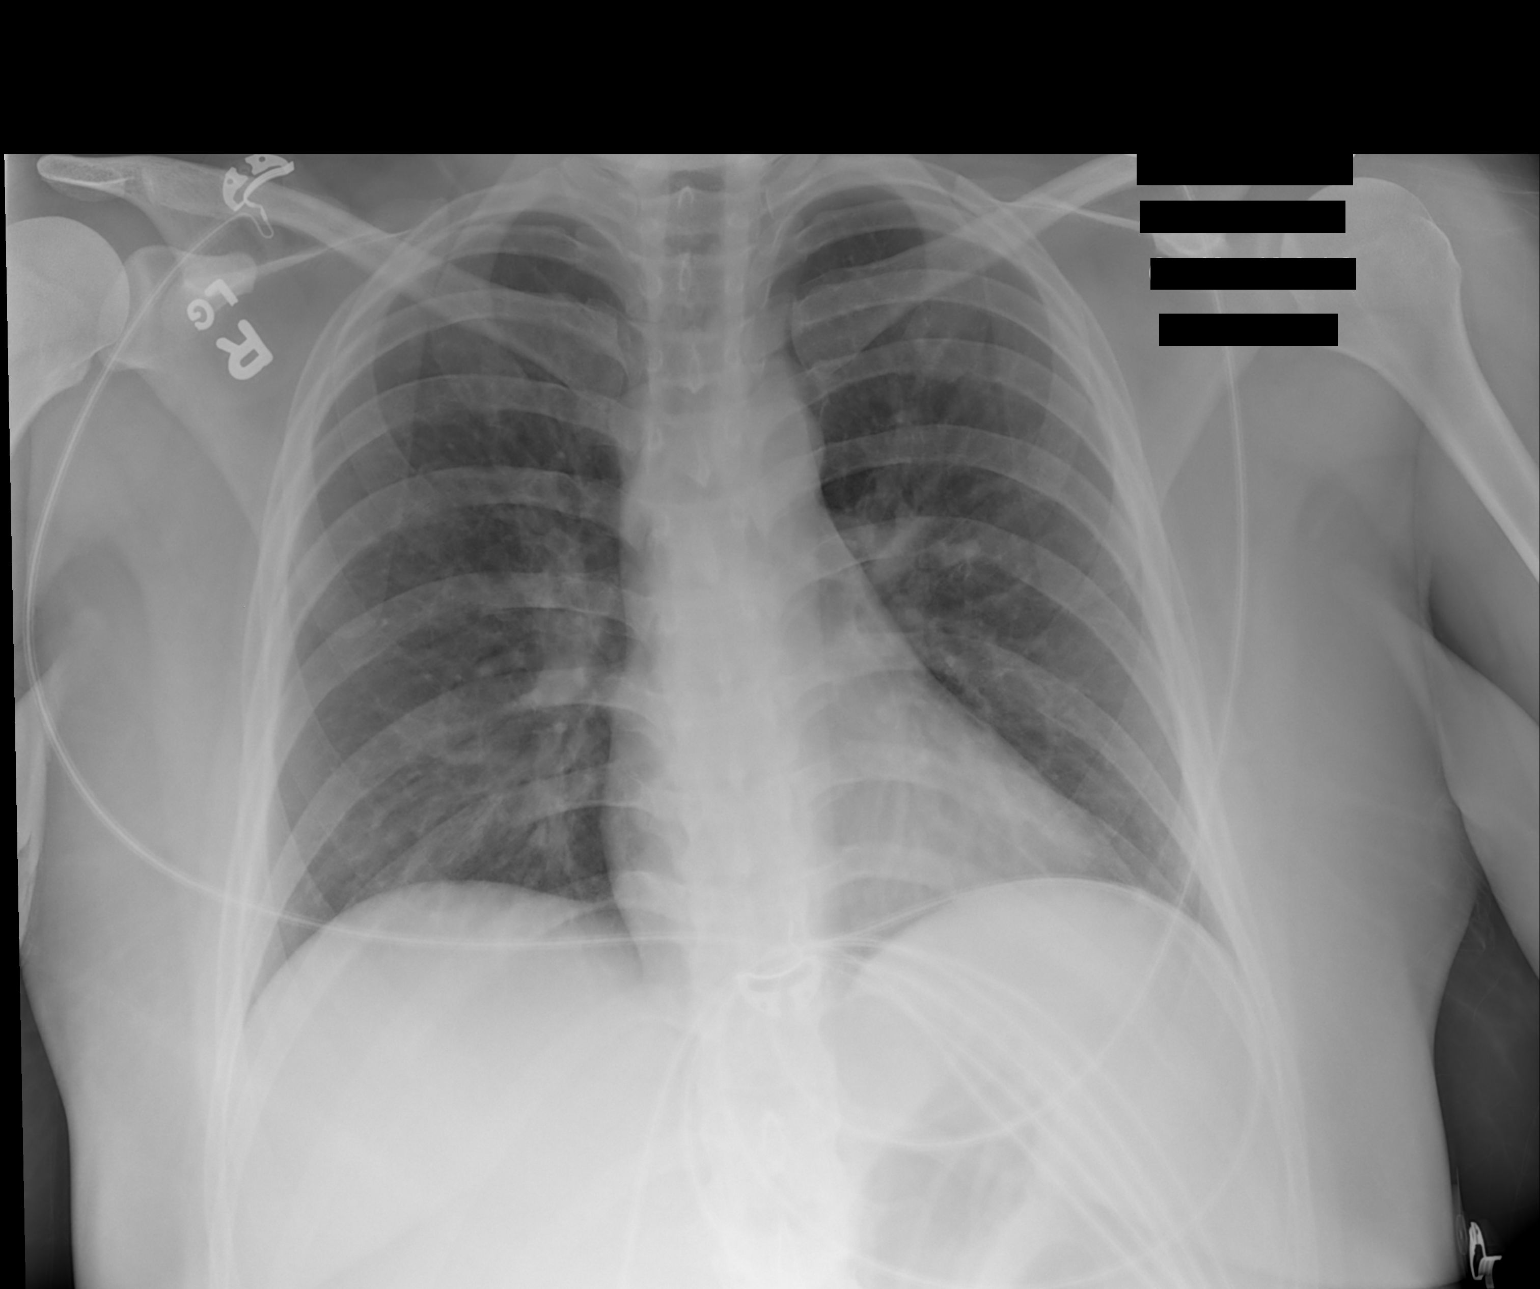

[1 of 1 positions shown; findings below may reference images not displayed]

FINDINGS: No evidence of edema or pulmonary infiltrates.  No
pleural effusions identified.  Heart size is normal.  Visualized
bony structures are unremarkable.
IMPRESSION: No active disease.

## 2011-11-27 ENCOUNTER — Other Ambulatory Visit: Payer: Self-pay | Admitting: Family Medicine

## 2011-11-27 ENCOUNTER — Other Ambulatory Visit (HOSPITAL_COMMUNITY)
Admission: RE | Admit: 2011-11-27 | Discharge: 2011-11-27 | Disposition: A | Discharge status: Home / Self Care | Payer: 59 | Source: Ambulatory Visit | Attending: Family Medicine | Admitting: Family Medicine

## 2011-11-27 DIAGNOSIS — Z124 Encounter for screening for malignant neoplasm of cervix: Secondary | ICD-10-CM | POA: Insufficient documentation

## 2011-11-27 DIAGNOSIS — Z113 Encounter for screening for infections with a predominantly sexual mode of transmission: Secondary | ICD-10-CM | POA: Insufficient documentation

## 2012-08-28 ENCOUNTER — Encounter (HOSPITAL_COMMUNITY): Payer: Self-pay | Admitting: Emergency Medicine

## 2012-08-28 ENCOUNTER — Emergency Department (HOSPITAL_COMMUNITY)
Admission: EM | Admit: 2012-08-28 | Discharge: 2012-08-28 | Disposition: A | Discharge status: Home / Self Care | Payer: Self-pay | Attending: Emergency Medicine | Admitting: Emergency Medicine

## 2012-08-28 DIAGNOSIS — J40 Bronchitis, not specified as acute or chronic: Secondary | ICD-10-CM

## 2012-08-28 DIAGNOSIS — J209 Acute bronchitis, unspecified: Secondary | ICD-10-CM | POA: Insufficient documentation

## 2012-08-28 DIAGNOSIS — J45909 Unspecified asthma, uncomplicated: Secondary | ICD-10-CM | POA: Insufficient documentation

## 2012-08-28 DIAGNOSIS — R197 Diarrhea, unspecified: Secondary | ICD-10-CM | POA: Insufficient documentation

## 2012-08-28 DIAGNOSIS — J069 Acute upper respiratory infection, unspecified: Secondary | ICD-10-CM | POA: Insufficient documentation

## 2012-08-28 DIAGNOSIS — F319 Bipolar disorder, unspecified: Secondary | ICD-10-CM | POA: Insufficient documentation

## 2012-08-28 HISTORY — DX: Unspecified asthma, uncomplicated: J45.909

## 2012-08-28 HISTORY — DX: Bipolar disorder, unspecified: F31.9

## 2012-08-28 HISTORY — DX: Other seasonal allergic rhinitis: J30.2

## 2012-08-28 MED ORDER — AZITHROMYCIN 250 MG PO TABS: ORAL_TABLET | ORAL | Status: DC

## 2012-08-28 NOTE — ED Notes (Signed)
Pt reports cough with internmittant diarrhea and lost of appetite

## 2012-08-28 NOTE — ED Provider Notes (Signed)
History     CSN: [id number]  Arrival date & time 08/28/12  1932   First MD Initiated Contact with Patient 08/28/12 2118      Chief Complaint  Patient presents with  . URI  . Diarrhea    (Consider location/radiation/quality/duration/timing/severity/associated sxs/prior treatment) HPI Comments: Patient presents with complaint of cough, nonproductive, and upper respiratory symptoms including nasal congestion for the past 10 days. Patient states that she thinks he she has bronchitis because she gets this every year. Patient has an inhaler which she's been using for relief of wheezing and cough. This has improved her symptoms. Patient denies fever, ear pain. Her throat is sore from coughing. Patient does not have any nausea, vomiting. Patient states that she is improved in the past with azithromycin when her symptoms last this long. Onset gradual. Course is constant. Nothing makes symptoms worse.  The patient also complains of some mild lower abdominal cramping and continued vaginal bleeding since an IUD placement. Patient states that she saw her GYN yesterday had this checked. There's been no change in these symptoms.  Patient also states that she has had some watery diarrhea for the past 7 days. This has been nonbloody. She states she is not worried about this. No recent antibiotic use. 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  Patient is a 26 y.o. female presenting with URI and diarrhea. The history is provided by the patient.  URI  Diarrhea The primary symptoms include diarrhea.    Past Medical History  Diagnosis Date  . Asthma   . Bipolar 1 disorder   . Seasonal allergic reaction     No past surgical history on file.  No family history on file.  History  Substance Use Topics  . Smoking status: Never Smoker   . Smokeless tobacco: Not on file  . Alcohol Use: No    OB History    Grav Para Term Preterm Abortions TAB SAB Ect Mult Living           Review of Systems  Gastrointestinal: Positive for diarrhea.  All other systems reviewed and are negative.    Allergies  Aspirin and Penicillins  Home Medications   Current Outpatient Rx  Name Route Sig Dispense Refill  . AZITHROMYCIN 250 MG PO TABS  Take two tablets PO on day 1 and one tablet PO days 2-5 6 tablet 0    BP 125/76  Pulse 104  Temp 98.9 F (37.2 C) (Oral)  Resp 18  SpO2 97%  Physical Exam  Nursing note and vitals reviewed. Constitutional: She appears well-developed and well-nourished.  HENT:  Head: Normocephalic and atraumatic. No trismus in the jaw.  Right Ear: Tympanic membrane, external ear and ear canal normal.  Left Ear: Tympanic membrane, external ear and ear canal normal.  Nose: Mucosal edema and rhinorrhea present.  Mouth/Throat: Uvula is midline and mucous membranes are normal. Mucous membranes are not dry. No oral lesions. No uvula swelling. Posterior oropharyngeal erythema present. No oropharyngeal exudate, posterior oropharyngeal edema or tonsillar abscesses.  Eyes: Conjunctivae normal are normal. Right eye exhibits no discharge. Left eye exhibits no discharge.  Neck: Normal range of motion. Neck supple.  Cardiovascular: Normal rate, regular rhythm and normal heart sounds.   Pulmonary/Chest: Effort normal and breath sounds normal. No respiratory distress. She has no wheezes. She has no rales.  Abdominal: Soft. There is no tenderness.  Lymphadenopathy:    She has no cervical adenopathy.  Neurological: She is alert.  Skin: Skin is warm and dry.  Psychiatric: She has a normal mood and affect.    ED Course  Procedures (including critical care time)  Labs Reviewed - No data to display No results found.   1. Bronchitis   2. Diarrhea     9:25 PM Patient seen and examined.    Vital signs reviewed and are as follows: Filed Vitals:   08/28/12 2010  BP: 125/76  Pulse: 104  Temp: 98.9 F (37.2 C)  Resp: 18   Patient counseled  on supportive care for URI and s/s to return including worsening symptoms, persistent fever, persistent vomiting, or if they have any other concerns.  Urged to see PCP if symptoms persist for more than 3 days. Patient verbalizes understanding and agrees with plan.     MDM  URI/bronchitis: abx ordered given duration of sx, no contraindications  Low abdominal/vaginal bleeding: recent IUD, patient has been seen for this and is being worked up elsewhere          [employer], [place] 08/30/12 1641

## 2012-08-31 NOTE — ED Provider Notes (Signed)
Medical screening examination/treatment/procedure(s) were performed by non-physician practitioner and as supervising physician I was immediately available for consultation/collaboration.  Ethelda Chick, MD 08/31/12 [phone]

## 2012-11-03 DIAGNOSIS — S022XXA Fracture of nasal bones, initial encounter for closed fracture: Secondary | ICD-10-CM

## 2012-11-03 HISTORY — DX: Fracture of nasal bones, initial encounter for closed fracture: S02.2XXA

## 2013-08-03 DIAGNOSIS — S022XXA Fracture of nasal bones, initial encounter for closed fracture: Secondary | ICD-10-CM | POA: Insufficient documentation

## 2013-09-05 DIAGNOSIS — J302 Other seasonal allergic rhinitis: Secondary | ICD-10-CM | POA: Insufficient documentation

## 2013-09-05 DIAGNOSIS — F3181 Bipolar II disorder: Secondary | ICD-10-CM | POA: Insufficient documentation

## 2013-11-12 ENCOUNTER — Emergency Department (HOSPITAL_COMMUNITY)
Admission: EM | Admit: 2013-11-12 | Discharge: 2013-11-12 | Disposition: A | Discharge status: Home / Self Care | Payer: BC Managed Care – PPO | Attending: Emergency Medicine | Admitting: Emergency Medicine

## 2013-11-12 ENCOUNTER — Encounter (HOSPITAL_COMMUNITY): Payer: Self-pay | Admitting: Emergency Medicine

## 2013-11-12 DIAGNOSIS — R69 Illness, unspecified: Secondary | ICD-10-CM

## 2013-11-12 DIAGNOSIS — O9989 Other specified diseases and conditions complicating pregnancy, childbirth and the puerperium: Secondary | ICD-10-CM | POA: Insufficient documentation

## 2013-11-12 DIAGNOSIS — R000 Tachycardia, unspecified: Secondary | ICD-10-CM | POA: Insufficient documentation

## 2013-11-12 DIAGNOSIS — Z79899 Other long term (current) drug therapy: Secondary | ICD-10-CM | POA: Insufficient documentation

## 2013-11-12 DIAGNOSIS — Z880 Allergy status to penicillin: Secondary | ICD-10-CM | POA: Insufficient documentation

## 2013-11-12 DIAGNOSIS — J111 Influenza due to unidentified influenza virus with other respiratory manifestations: Secondary | ICD-10-CM | POA: Insufficient documentation

## 2013-11-12 DIAGNOSIS — F319 Bipolar disorder, unspecified: Secondary | ICD-10-CM | POA: Insufficient documentation

## 2013-11-12 DIAGNOSIS — O99340 Other mental disorders complicating pregnancy, unspecified trimester: Secondary | ICD-10-CM | POA: Insufficient documentation

## 2013-11-12 DIAGNOSIS — J45909 Unspecified asthma, uncomplicated: Secondary | ICD-10-CM | POA: Insufficient documentation

## 2013-11-12 DIAGNOSIS — R1110 Vomiting, unspecified: Secondary | ICD-10-CM | POA: Insufficient documentation

## 2013-11-12 MED ORDER — ALBUTEROL SULFATE HFA 108 (90 BASE) MCG/ACT IN AERS: 2.0000 | INHALATION_SPRAY | RESPIRATORY_TRACT | Status: DC | PRN

## 2013-11-12 MED ORDER — OSELTAMIVIR PHOSPHATE 75 MG PO CAPS: 75.0000 mg | ORAL_CAPSULE | Freq: Two times a day (BID) | ORAL | Status: DC

## 2013-11-12 NOTE — ED Notes (Addendum)
Pt c/o body aches, nasal congestion, ?fever, sore throat, chills, +n/v. Pt is 24 weeks preg. Pt does work in a day care.

## 2013-11-12 NOTE — ED Notes (Signed)
Pt transported from home for c/o body aches, n/v. Pt is 24 weeks preg.

## 2013-11-12 NOTE — Discharge Instructions (Signed)
Your baby appears to be doing well on the monitor - however please be aware that if your body is affected by a flu like illness, your baby will feel the effects as well.  In pregnancy, the treatment for flu is with the flu medicine which has minimal effect but may help to resolve your illness faster than without the medicine.  The medicine should be safe in pregnancy.  Please follow up with your doctor in 2 days for recheck.  Please call your doctor for a followup appointment within 24-48 hours. When you talk to your doctor please let them know that you were seen in the emergency department and have them acquire all of your records so that they can discuss the findings with you and formulate a treatment plan to fully care for your new and ongoing problems.

## 2013-11-12 NOTE — ED Provider Notes (Signed)
CSN: [id number]     Arrival date & time 11/12/13  2118 History   First MD Initiated Contact with Patient 11/12/13 2151     Chief Complaint  Patient presents with  . Influenza   (Consider location/radiation/quality/duration/timing/severity/associated sxs/prior Treatment) HPI Comments: Pt presents with 3 days of fever, chills, cough, runny nose and sore throat - she has had a couple of episodes of vomiting in the morning the last 2 days as well.  She did have the flu shot prior to this.  She has hx of asthma.  Has tried tylenol / pseudophed with minimal improvement.  Sx are persistent.  No associated diarrhea.  Has been taking plenty of fluids.  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  Patient is a 28 y.o. female presenting with flu symptoms. The history is provided by the patient.  Influenza   Past Medical History  Diagnosis Date  . Asthma   . Bipolar 1 disorder   . Seasonal allergic reaction    History reviewed. No pertinent past surgical history. No family history on file. History  Substance Use Topics  . Smoking status: Never Smoker   . Smokeless tobacco: Not on file  . Alcohol Use: No   OB History   Grav Para Term Preterm Abortions TAB SAB Ect Mult Living   1              Review of Systems  All other systems reviewed and are negative.    Allergies  Aspirin and Penicillins  Home Medications   Current Outpatient Rx  Name  Route  Sig  Dispense  Refill  . acetaminophen (TYLENOL) 500 MG tablet   Oral   Take 500 mg by mouth every 6 (six) hours as needed.         . diphenhydrAMINE (BENADRYL) 25 MG tablet   Oral   Take 25 mg by mouth every 6 (six) hours as needed.         . Prenatal Vit-Fe Fumarate-FA (PRENATAL MULTIVITAMIN) TABS tablet   Oral   Take 1 tablet by mouth daily at 12 noon.         . QUEtiapine (SEROQUEL) 200 MG tablet   Oral   Take 200 mg by mouth at bedtime.         Marland Kitchen trimethoprim-polymyxin b  (POLYTRIM) ophthalmic solution   Both Eyes   Place 1 drop into both eyes every 4 (four) hours.         Marland Kitchen oseltamivir (TAMIFLU) 75 MG capsule   Oral   Take 1 capsule (75 mg total) by mouth every 12 (twelve) hours.   10 capsule   0    BP 125/73  Pulse 120  Temp(Src) 98.9 F (37.2 C) (Oral)  Resp 20  Wt 185 lb (83.915 kg)  SpO2 99%  LMP 05/29/2013 Physical Exam  Nursing note and vitals reviewed. Constitutional: She appears well-developed and well-nourished. No distress.  HENT:  Head: Normocephalic and atraumatic.  Mouth/Throat: Oropharynx is clear and moist. No oropharyngeal exudate.  Nasal secretions =- clear rhinorrhea  Eyes: Conjunctivae and EOM are normal. Pupils are equal, round, and reactive to light. Right eye exhibits no discharge. Left eye exhibits no discharge. No scleral icterus.  Neck: Normal range of motion. Neck supple. No JVD present. No thyromegaly present.  Cardiovascular: Regular rhythm, normal heart sounds and intact distal pulses.  Tachycardia present.  Exam reveals no gallop and no friction rub.   No  murmur heard. Pulses:      Radial pulses are 2+ on the right side, and 2+ on the left side.  Pulmonary/Chest: Effort normal and breath sounds normal. No respiratory distress. She has no wheezes. She has no rales.  Abdominal: Soft. Bowel sounds are normal. She exhibits no distension and no mass. There is no tenderness.  Pregnancy palpated above umbilicus.  Positive fetal movements  Musculoskeletal: Normal range of motion. She exhibits no edema and no tenderness.  Lymphadenopathy:    She has no cervical adenopathy.  Neurological: She is alert. Coordination normal.  Skin: Skin is warm and dry. No rash noted. No erythema.  Psychiatric: She has a normal mood and affect. Her behavior is normal.    ED Course  Procedures (including critical care time) Labs Review Labs Reviewed - No data to display Imaging Review No results found.  EKG Interpretation   None        MDM   1. Influenza-like illness    Pt is overall well appaering, her pulse is around 110 bpm at this time - she has no respiratory distress, no LAD of the neck and s/s c/w influenza.  I will check FHT's, pt declines IVF and is taking oral fluids well.  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  Doubt other source of sx given fever URI sx and mild cough which she states is non productive.  She has no CP and does not feel SOB.  Her O2 sat is 99% on ra with no tachypnea.  I have personally done a bedside US to check FHT's - visualized at 148 bpm.  Fetal movements visualized and grossly normal.  Again  Mother has no abd c/o at this time.  Meds given in ED:  Medications - No data to display  New Prescriptions   OSELTAMIVIR (TAMIFLU) 75 MG CAPSULE    Take 1 capsule (75 mg total) by mouth every 12 (twelve) hours.      Vida RollerBrian D Oleg Oleson, MD 11/12/13 [phone]

## 2014-09-04 ENCOUNTER — Encounter (HOSPITAL_COMMUNITY): Payer: Self-pay | Admitting: Emergency Medicine

## 2015-08-03 ENCOUNTER — Encounter (HOSPITAL_COMMUNITY): Payer: Self-pay | Admitting: Emergency Medicine

## 2015-08-03 ENCOUNTER — Emergency Department (HOSPITAL_COMMUNITY)
Admission: EM | Admit: 2015-08-03 | Discharge: 2015-08-03 | Disposition: A | Discharge status: Home / Self Care | Payer: Self-pay | Attending: Emergency Medicine | Admitting: Emergency Medicine

## 2015-08-03 DIAGNOSIS — Z3202 Encounter for pregnancy test, result negative: Secondary | ICD-10-CM | POA: Insufficient documentation

## 2015-08-03 DIAGNOSIS — Z79899 Other long term (current) drug therapy: Secondary | ICD-10-CM | POA: Insufficient documentation

## 2015-08-03 DIAGNOSIS — Z880 Allergy status to penicillin: Secondary | ICD-10-CM | POA: Insufficient documentation

## 2015-08-03 DIAGNOSIS — F319 Bipolar disorder, unspecified: Secondary | ICD-10-CM | POA: Insufficient documentation

## 2015-08-03 DIAGNOSIS — R1030 Lower abdominal pain, unspecified: Secondary | ICD-10-CM | POA: Insufficient documentation

## 2015-08-03 DIAGNOSIS — J45901 Unspecified asthma with (acute) exacerbation: Secondary | ICD-10-CM | POA: Insufficient documentation

## 2015-08-03 LAB — URINALYSIS, ROUTINE W REFLEX MICROSCOPIC
Bilirubin Urine: NEGATIVE
GLUCOSE, UA: NEGATIVE mg/dL
HGB URINE DIPSTICK: NEGATIVE
Ketones, ur: NEGATIVE mg/dL
Leukocytes, UA: NEGATIVE
Nitrite: NEGATIVE
PH: 7.5 (ref 5.0–8.0)
PROTEIN: NEGATIVE mg/dL
Specific Gravity, Urine: 1.023 (ref 1.005–1.030)
Urobilinogen, UA: 1 mg/dL (ref 0.0–1.0)

## 2015-08-03 LAB — COMPREHENSIVE METABOLIC PANEL
ALBUMIN: 4.2 g/dL (ref 3.5–5.0)
ALK PHOS: 78 U/L (ref 38–126)
ALT: 27 U/L (ref 14–54)
AST: 27 U/L (ref 15–41)
Anion gap: 9 (ref 5–15)
BUN: 12 mg/dL (ref 6–20)
CALCIUM: 9.5 mg/dL (ref 8.9–10.3)
CHLORIDE: 106 mmol/L (ref 101–111)
CO2: 25 mmol/L (ref 22–32)
CREATININE: 0.65 mg/dL (ref 0.44–1.00)
GFR calc Af Amer: >60 mL/min (ref >60)
GFR calc non Af Amer: >60 mL/min (ref >60)
GLUCOSE: 97 mg/dL (ref 65–99)
Potassium: 4.4 mmol/L (ref 3.5–5.1)
SODIUM: 140 mmol/L (ref 135–145)
Total Bilirubin: 1 mg/dL (ref 0.3–1.2)
Total Protein: 7.3 g/dL (ref 6.5–8.1)

## 2015-08-03 LAB — CBC
HCT: 48.2 % — ABNORMAL HIGH (ref 36.0–46.0)
HEMOGLOBIN: 16.2 g/dL — AB (ref 12.0–15.0)
MCH: 29 pg (ref 26.0–34.0)
MCHC: 33.6 g/dL (ref 30.0–36.0)
MCV: 86.2 fL (ref 78.0–100.0)
Platelets: 166 10*3/uL (ref 150–400)
RBC: 5.59 MIL/uL — ABNORMAL HIGH (ref 3.87–5.11)
RDW: 12.2 % (ref 11.5–15.5)
WBC: 8.2 10*3/uL (ref 4.0–10.5)

## 2015-08-03 LAB — PREGNANCY, URINE: PREG TEST UR: NEGATIVE

## 2015-08-03 LAB — LIPASE, BLOOD: Lipase: 26 U/L (ref 22–51)

## 2015-08-03 MED ORDER — PREDNISONE 10 MG PO TABS: 40.0000 mg | ORAL_TABLET | Freq: Every day | ORAL | Status: DC

## 2015-08-03 MED ORDER — IPRATROPIUM-ALBUTEROL 0.5-2.5 (3) MG/3ML IN SOLN
3.0000 mL | Freq: Once | RESPIRATORY_TRACT | Status: AC
  Administered 2015-08-03: 3 mL via RESPIRATORY_TRACT
  Filled 2015-08-03: qty 3

## 2015-08-03 MED ORDER — PREDNISONE 20 MG PO TABS
60.0000 mg | ORAL_TABLET | Freq: Once | ORAL | Status: AC
  Administered 2015-08-03: 60 mg via ORAL
  Filled 2015-08-03: qty 3

## 2015-08-03 NOTE — ED Notes (Signed)
Patient states having asthma problems.   Patient speaking in full sentences and NAD in triage.   Patient also states lower abdominal pain.  Patient states her periods have been sporadic the last few months, recently took a pregnancy test which was negative.  Patient states nausea, but no vomiting.

## 2015-08-03 NOTE — ED Provider Notes (Signed)
CSN: [id number]     Arrival date & time 08/03/15  3664 History   First MD Initiated Contact with Patient 08/03/15 [phone]     Chief Complaint  Patient presents with  . Abdominal Pain  . Asthma  . Nausea     (Consider location/radiation/quality/duration/timing/severity/associated sxs/prior Treatment) Patient is a 29 y.o. female presenting with abdominal pain and asthma. The history is provided by the patient.  Abdominal Pain Associated symptoms: nausea and shortness of breath   Associated symptoms: no chest pain, no dysuria, no fever and no vomiting   Asthma Associated symptoms include abdominal pain and shortness of breath. Pertinent negatives include no chest pain and no headaches.   patient presents with 2 concerns. One is the persistent difficulty with the asthma. Patient has long-standing history of asthma as well as allergies. Patient currently only using albuterol nebulizers. Patient currently not on a sterilely. In addition patient's had some vague lower abdominal pain for the past few weeks home pregnancy test a been negative. The patient has not had a regular period now for several months. Patient has some concern that she may be pregnant.  Past Medical History  Diagnosis Date  . Asthma   . Bipolar 1 disorder   . Seasonal allergic reaction    History reviewed. No pertinent past surgical history. History reviewed. No pertinent family history. Social History  Substance Use Topics  . Smoking status: Never Smoker   . Smokeless tobacco: None  . Alcohol Use: No   OB History    Gravida Para Term Preterm AB TAB SAB Ectopic Multiple Living   1              Review of Systems  Constitutional: Negative for fever.  HENT: Positive for congestion.   Eyes: Negative for visual disturbance.  Respiratory: Positive for shortness of breath and wheezing.   Cardiovascular: Negative for chest pain.  Gastrointestinal: Positive for nausea and abdominal pain. Negative for vomiting.   Genitourinary: Negative for dysuria.  Musculoskeletal: Negative for back pain.  Skin: Negative for rash.  Neurological: Negative for headaches.  Hematological: Does not bruise/bleed easily.  Psychiatric/Behavioral: Negative for confusion.      Allergies  Aspirin and Penicillins  Home Medications   Prior to Admission medications   Medication Sig Start Date End Date Taking? Authorizing Provider  acetaminophen (TYLENOL) 500 MG tablet Take 500 mg by mouth every 6 (six) hours as needed.   Yes Historical Provider, MD  albuterol (PROVENTIL HFA;VENTOLIN HFA) 108 (90 BASE) MCG/ACT inhaler Inhale 2 puffs into the lungs every 4 (four) hours as needed for wheezing or shortness of breath. 11/12/13  Yes Eber Hong, MD  diphenhydrAMINE (BENADRYL) 25 MG tablet Take 25 mg by mouth every 6 (six) hours as needed.   Yes Historical Provider, MD  lamoTRIgine (LAMICTAL) 100 MG tablet Take 100 mg by mouth at bedtime.   Yes Historical Provider, MD  lithium 300 MG tablet Take 900 mg by mouth at bedtime.   Yes Historical Provider, MD  QUEtiapine (SEROQUEL) 200 MG tablet Take 200 mg by mouth at bedtime.   Yes Historical Provider, MD  predniSONE (DELTASONE) 10 MG tablet Take 4 tablets (40 mg total) by mouth daily. 08/03/15   Vanetta Mulders, MD  Prenatal Vit-Fe Fumarate-FA (PRENATAL MULTIVITAMIN) TABS tablet Take 1 tablet by mouth daily at 12 noon.    Historical Provider, MD   BP 114/68 mmHg  Pulse 95  Temp(Src) 98.8 F (37.1 C) (Oral)  Resp 23  SpO2 97%  LMP 06/02/2015  Breastfeeding? Unknown Physical Exam  Constitutional: She is oriented to person, place, and time. She appears well-developed and well-nourished. No distress.  HENT:  Head: Normocephalic and atraumatic.  Mouth/Throat: Oropharynx is clear and moist.  Eyes: Conjunctivae and EOM are normal. Pupils are equal, round, and reactive to light.  Neck: Normal range of motion. Neck supple.  Cardiovascular: Normal rate, regular rhythm and normal  heart sounds.   Pulmonary/Chest: Effort normal and breath sounds normal. She has no wheezes.  Abdominal: Soft. Bowel sounds are normal. There is no tenderness.  Musculoskeletal: Normal range of motion.  Neurological: She is alert and oriented to person, place, and time. No cranial nerve deficit. She exhibits normal muscle tone. Coordination normal.  Skin: Skin is warm.  Nursing note and vitals reviewed.   ED Course  Procedures (including critical care time) Labs Review Labs Reviewed  CBC - Abnormal; Notable for the following:    RBC 5.59 (*)    Hemoglobin 16.2 (*)    HCT 48.2 (*)    All other components within normal limits  URINALYSIS, ROUTINE W REFLEX MICROSCOPIC (NOT AT [facility]) - Abnormal; Notable for the following:    APPearance HAZY (*)    All other components within normal limits  LIPASE, BLOOD  COMPREHENSIVE METABOLIC PANEL  PREGNANCY, URINE  POC URINE PREG, ED   Results for orders placed or performed during the hospital encounter of 08/03/15  Lipase, blood  Result Value Ref Range   Lipase 26 22 - 51 U/L  Comprehensive metabolic panel  Result Value Ref Range   Sodium 140 135 - 145 mmol/L   Potassium 4.4 3.5 - 5.1 mmol/L   Chloride 106 101 - 111 mmol/L   CO2 25 22 - 32 mmol/L   Glucose, Bld 97 65 - 99 mg/dL   BUN 12 6 - 20 mg/dL   Creatinine, Ser 1.61 0.44 - 1.00 mg/dL   Calcium 9.5 8.9 - 09.6 mg/dL   Total Protein 7.3 6.5 - 8.1 g/dL   Albumin 4.2 3.5 - 5.0 g/dL   AST 27 15 - 41 U/L   ALT 27 14 - 54 U/L   Alkaline Phosphatase 78 38 - 126 U/L   Total Bilirubin 1.0 0.3 - 1.2 mg/dL   GFR calc non Af Amer >60 >60 mL/min   GFR calc Af Amer >60 >60 mL/min   Anion gap 9 5 - 15  CBC  Result Value Ref Range   WBC 8.2 4.0 - 10.5 K/uL   RBC 5.59 (H) 3.87 - 5.11 MIL/uL   Hemoglobin 16.2 (H) 12.0 - 15.0 g/dL   HCT 04.5 (H) 40.9 - 81.1 %   MCV 86.2 78.0 - 100.0 fL   MCH 29.0 26.0 - 34.0 pg   MCHC 33.6 30.0 - 36.0 g/dL   RDW 91.4 78.2 - 95.6 %   Platelets 166 150 - 400  K/uL  Urinalysis, Routine w reflex microscopic (not at [facility])  Result Value Ref Range   Color, Urine YELLOW YELLOW   APPearance HAZY (A) CLEAR   Specific Gravity, Urine 1.023 1.005 - 1.030   pH 7.5 5.0 - 8.0   Glucose, UA NEGATIVE NEGATIVE mg/dL   Hgb urine dipstick NEGATIVE NEGATIVE   Bilirubin Urine NEGATIVE NEGATIVE   Ketones, ur NEGATIVE NEGATIVE mg/dL   Protein, ur NEGATIVE NEGATIVE mg/dL   Urobilinogen, UA 1.0 0.0 - 1.0 mg/dL   Nitrite NEGATIVE NEGATIVE   Leukocytes, UA NEGATIVE NEGATIVE  Pregnancy, urine  Result Value Ref Range  Preg Test, Ur NEGATIVE NEGATIVE     Imaging Review No results found. I have personally reviewed and evaluated these images and lab results as part of my medical decision-making.   EKG Interpretation None      MDM   Final diagnoses:  Asthma exacerbation  Lower abdominal pain   Patient with long-standing history of asthma, patient using albuterol nebulizer at home. Patient also a history of allergies. Patient currently not taking anything for the allergies. Patient felt better after nebulizer treatments here in the prednisone. Although there was no audible wheezing heard. Patient also was concerned about being pregnant she's had missed periods. See test was negative. The lower abdominal pain is very mild. Labs without any significant abnormalities regarding that. No evidence of any urinary tract infection. Patient will follow with her primary care doctor. Patient nontoxic no acute distress. Patient feels better after the treatment.     Vanetta Mulders, MD 08/03/15 [phone]

## 2015-08-03 NOTE — Discharge Instructions (Signed)
Continue using your albuterol nebulizer. Take the prednisone as directed for the next 5 days. Make appointment to follow up with your regular doctor about the missed periods and abdominal pain.

## 2016-03-31 DIAGNOSIS — Z888 Allergy status to other drugs, medicaments and biological substances status: Secondary | ICD-10-CM | POA: Diagnosis not present

## 2016-03-31 DIAGNOSIS — Z880 Allergy status to penicillin: Secondary | ICD-10-CM | POA: Diagnosis not present

## 2016-03-31 DIAGNOSIS — F319 Bipolar disorder, unspecified: Secondary | ICD-10-CM | POA: Diagnosis not present

## 2016-03-31 DIAGNOSIS — Z79899 Other long term (current) drug therapy: Secondary | ICD-10-CM | POA: Diagnosis not present

## 2016-03-31 DIAGNOSIS — Z87891 Personal history of nicotine dependence: Secondary | ICD-10-CM | POA: Diagnosis not present

## 2016-03-31 DIAGNOSIS — Z711 Person with feared health complaint in whom no diagnosis is made: Secondary | ICD-10-CM | POA: Diagnosis not present

## 2016-07-23 DIAGNOSIS — J301 Allergic rhinitis due to pollen: Secondary | ICD-10-CM | POA: Diagnosis not present

## 2016-07-23 DIAGNOSIS — J069 Acute upper respiratory infection, unspecified: Secondary | ICD-10-CM | POA: Diagnosis not present

## 2016-07-31 DIAGNOSIS — Z23 Encounter for immunization: Secondary | ICD-10-CM | POA: Diagnosis not present

## 2016-11-21 DIAGNOSIS — E282 Polycystic ovarian syndrome: Secondary | ICD-10-CM | POA: Insufficient documentation

## 2016-12-23 DIAGNOSIS — N898 Other specified noninflammatory disorders of vagina: Secondary | ICD-10-CM | POA: Diagnosis not present

## 2017-01-22 ENCOUNTER — Other Ambulatory Visit: Payer: Self-pay | Admitting: Nurse Practitioner

## 2017-01-22 ENCOUNTER — Other Ambulatory Visit (HOSPITAL_COMMUNITY)
Admission: RE | Admit: 2017-01-22 | Discharge: 2017-01-22 | Disposition: A | Discharge status: Home / Self Care | Payer: BLUE CROSS/BLUE SHIELD | Source: Ambulatory Visit | Attending: Obstetrics and Gynecology | Admitting: Obstetrics and Gynecology

## 2017-01-22 DIAGNOSIS — Z01419 Encounter for gynecological examination (general) (routine) without abnormal findings: Secondary | ICD-10-CM | POA: Insufficient documentation

## 2017-01-22 DIAGNOSIS — N898 Other specified noninflammatory disorders of vagina: Secondary | ICD-10-CM | POA: Diagnosis not present

## 2017-01-22 DIAGNOSIS — N910 Primary amenorrhea: Secondary | ICD-10-CM | POA: Diagnosis not present

## 2017-01-26 LAB — CYTOLOGY - PAP: DIAGNOSIS: NEGATIVE

## 2017-02-26 DIAGNOSIS — E282 Polycystic ovarian syndrome: Secondary | ICD-10-CM | POA: Diagnosis not present

## 2017-02-26 DIAGNOSIS — L680 Hirsutism: Secondary | ICD-10-CM | POA: Diagnosis not present

## 2017-02-26 DIAGNOSIS — N910 Primary amenorrhea: Secondary | ICD-10-CM | POA: Diagnosis not present

## 2017-03-03 DIAGNOSIS — Z713 Dietary counseling and surveillance: Secondary | ICD-10-CM | POA: Diagnosis not present

## 2017-08-11 ENCOUNTER — Encounter: Payer: Self-pay | Admitting: Obstetrics and Gynecology

## 2017-08-20 ENCOUNTER — Encounter: Payer: Self-pay | Admitting: Obstetrics and Gynecology

## 2017-08-25 DIAGNOSIS — M7989 Other specified soft tissue disorders: Secondary | ICD-10-CM | POA: Diagnosis not present

## 2017-08-25 DIAGNOSIS — M25571 Pain in right ankle and joints of right foot: Secondary | ICD-10-CM | POA: Diagnosis not present

## 2017-08-25 DIAGNOSIS — S93401A Sprain of unspecified ligament of right ankle, initial encounter: Secondary | ICD-10-CM | POA: Diagnosis not present

## 2017-08-25 DIAGNOSIS — S99911A Unspecified injury of right ankle, initial encounter: Secondary | ICD-10-CM | POA: Diagnosis not present

## 2017-10-29 DIAGNOSIS — E282 Polycystic ovarian syndrome: Secondary | ICD-10-CM | POA: Diagnosis not present

## 2017-10-29 DIAGNOSIS — N910 Primary amenorrhea: Secondary | ICD-10-CM | POA: Diagnosis not present

## 2017-10-29 DIAGNOSIS — Z309 Encounter for contraceptive management, unspecified: Secondary | ICD-10-CM | POA: Diagnosis not present

## 2017-11-24 DIAGNOSIS — Z30017 Encounter for initial prescription of implantable subdermal contraceptive: Secondary | ICD-10-CM | POA: Diagnosis not present

## 2017-12-17 DIAGNOSIS — S91201A Unspecified open wound of right great toe with damage to nail, initial encounter: Secondary | ICD-10-CM | POA: Diagnosis not present

## 2018-01-07 DIAGNOSIS — N910 Primary amenorrhea: Secondary | ICD-10-CM | POA: Diagnosis not present

## 2018-01-12 DIAGNOSIS — F4322 Adjustment disorder with anxiety: Secondary | ICD-10-CM | POA: Diagnosis not present

## 2018-01-15 DIAGNOSIS — J019 Acute sinusitis, unspecified: Secondary | ICD-10-CM | POA: Diagnosis not present

## 2018-01-15 DIAGNOSIS — J069 Acute upper respiratory infection, unspecified: Secondary | ICD-10-CM | POA: Diagnosis not present

## 2018-02-02 DIAGNOSIS — F4322 Adjustment disorder with anxiety: Secondary | ICD-10-CM | POA: Diagnosis not present

## 2018-07-21 DIAGNOSIS — E349 Endocrine disorder, unspecified: Secondary | ICD-10-CM | POA: Diagnosis not present

## 2018-07-21 DIAGNOSIS — N910 Primary amenorrhea: Secondary | ICD-10-CM | POA: Diagnosis not present

## 2018-08-07 ENCOUNTER — Ambulatory Visit (HOSPITAL_COMMUNITY)
Admission: EM | Admit: 2018-08-07 | Discharge: 2018-08-07 | Disposition: A | Discharge status: Home / Self Care | Payer: BLUE CROSS/BLUE SHIELD | Attending: Emergency Medicine | Admitting: Emergency Medicine

## 2018-08-07 ENCOUNTER — Encounter (HOSPITAL_COMMUNITY): Payer: Self-pay

## 2018-08-07 ENCOUNTER — Telehealth (HOSPITAL_COMMUNITY): Payer: Self-pay

## 2018-08-07 DIAGNOSIS — Z880 Allergy status to penicillin: Secondary | ICD-10-CM | POA: Insufficient documentation

## 2018-08-07 DIAGNOSIS — J45909 Unspecified asthma, uncomplicated: Secondary | ICD-10-CM | POA: Insufficient documentation

## 2018-08-07 DIAGNOSIS — R509 Fever, unspecified: Secondary | ICD-10-CM | POA: Diagnosis not present

## 2018-08-07 DIAGNOSIS — Z7952 Long term (current) use of systemic steroids: Secondary | ICD-10-CM | POA: Insufficient documentation

## 2018-08-07 DIAGNOSIS — Z79899 Other long term (current) drug therapy: Secondary | ICD-10-CM | POA: Insufficient documentation

## 2018-08-07 DIAGNOSIS — F319 Bipolar disorder, unspecified: Secondary | ICD-10-CM | POA: Insufficient documentation

## 2018-08-07 DIAGNOSIS — R5383 Other fatigue: Secondary | ICD-10-CM

## 2018-08-07 DIAGNOSIS — J029 Acute pharyngitis, unspecified: Secondary | ICD-10-CM

## 2018-08-07 LAB — POCT INFECTIOUS MONO SCREEN: Mono Screen: NEGATIVE

## 2018-08-07 LAB — POCT RAPID STREP A: Streptococcus, Group A Screen (Direct): NEGATIVE

## 2018-08-07 MED ORDER — PREDNISONE 20 MG PO TABS
ORAL_TABLET | ORAL | Status: AC
  Filled 2018-08-07: qty 1

## 2018-08-07 MED ORDER — PREDNISONE 20 MG PO TABS
20.0000 mg | ORAL_TABLET | Freq: Once | ORAL | Status: AC
  Administered 2018-08-07: 20 mg via ORAL

## 2018-08-07 MED ORDER — MAGIC MOUTHWASH W/LIDOCAINE: 5.0000 mL | Freq: Four times a day (QID) | ORAL | 0 refills | Status: DC | PRN

## 2018-08-07 NOTE — Discharge Instructions (Signed)
Do not return to work until you are fever free for 24 hours.

## 2018-08-07 NOTE — ED Provider Notes (Signed)
[facility]    CSN: [id number] Arrival date & time: 08/07/18  1533     History   Chief Complaint Chief Complaint  Patient presents with  . Sore Throat    HPI Summer Arnold is a 32 y.o. female.   HPI   Patient states that approximately 8 days ago she came down with a flulike illness.  Patient states that she was sick over the weekend for approximately 3 to 4 days but started to feel better that Monday and went back to work.  Patient works in a daycare.  Patient states that starting this past Friday she started to have a second sickening.  Patient states her throat became incredibly sore and she felt as though she did last weekend.  She has been having fever and taking ibuprofen for comfort.  Her last dose was at approximately 6 hours ago.   She denies jaw pain or pain with eating.  States her immunizations are current and up to date.   Past Medical History:  Diagnosis Date  . Asthma   . Bipolar 1 disorder (HCC)   . Seasonal allergic reaction     There are no active problems to display for this patient.   History reviewed. No pertinent surgical history.  OB History    Gravida  1   Para      Term      Preterm      AB      Living        SAB      TAB      Ectopic      Multiple      Live Births               Home Medications    Prior to Admission medications   Medication Sig Start Date End Date Taking? Authorizing Provider  acetaminophen (TYLENOL) 500 MG tablet Take 500 mg by mouth every 6 (six) hours as needed.    [provider]  albuterol (PROVENTIL HFA;VENTOLIN HFA) 108 (90 BASE) MCG/ACT inhaler Inhale 2 puffs into the lungs every 4 (four) hours as needed for wheezing or shortness of breath. 11/12/13   Eber Hong, MD  diphenhydrAMINE (BENADRYL) 25 MG tablet Take 25 mg by mouth every 6 (six) hours as needed.    [provider]  lamoTRIgine (LAMICTAL) 100 MG tablet Take 100 mg by mouth at bedtime.    [provider]  lithium 300 MG tablet Take 900 mg by mouth at bedtime.    [provider]  magic mouthwash w/lidocaine SOLN Take 5 mLs by mouth 4 (four) times daily as needed for mouth pain (Swish, gargle, and spit one to 2 teaspoonfuls every 6 hours as needed. May be swallowed if desired.). 08/07/18   Servando Salina, NP  predniSONE (DELTASONE) 10 MG tablet Take 4 tablets (40 mg total) by mouth daily. 08/03/15   Vanetta Mulders, MD  Prenatal Vit-Fe Fumarate-FA (PRENATAL MULTIVITAMIN) TABS tablet Take 1 tablet by mouth daily at 12 noon.    [provider]  QUEtiapine (SEROQUEL) 200 MG tablet Take 200 mg by mouth at bedtime.    [provider]    Family History History reviewed. No pertinent family history.  Social History Social History   Tobacco Use  . Smoking status: Never Smoker  Substance Use Topics  . Alcohol use: No  . Drug use: No     Allergies   Aspirin and Penicillins   Review of Systems  Review of Systems  Constitutional: Positive for appetite change, chills, fatigue and fever.  HENT: Positive for sore throat. Negative for trouble swallowing and voice change.   Eyes: Negative.  Negative for visual disturbance.  Respiratory: Negative.  Negative for cough, chest tightness, shortness of breath and wheezing.   Cardiovascular: Negative.  Negative for chest pain, palpitations and leg swelling.  Gastrointestinal: Negative.  Negative for diarrhea, nausea and vomiting.  Endocrine: Negative.   Genitourinary: Negative.   Musculoskeletal: Negative.  Negative for gait problem and neck stiffness.  Skin: Negative.  Negative for rash.  Allergic/Immunologic: Negative.   Neurological: Negative.  Negative for dizziness and headaches.  Hematological: Negative.   Psychiatric/Behavioral: Negative.      Physical Exam Triage Vital Signs ED Triage Vitals [08/07/18 1555]  Enc Vitals Group     BP 134/88     Pulse Rate (!) 101     Resp 20     Temp 98.5 F  (36.9 C)     Temp Source Oral     SpO2 99 %     Weight      Height      Head Circumference      Peak Flow      Pain Score      Pain Loc      Pain Edu?      Excl. in GC?    No data found.  Updated Vital Signs BP 134/88 (BP Location: Right Arm)   Pulse (!) 101   Temp 98.5 F (36.9 C) (Oral)   Resp 20   SpO2 99%   Visual Acuity Right Eye Distance:   Left Eye Distance:   Bilateral Distance:    Right Eye Near:   Left Eye Near:    Bilateral Near:     Physical Exam  Constitutional: She is oriented to person, place, and time. She appears well-developed and well-nourished. No distress.  HENT:  Head: Normocephalic and atraumatic.  Right Ear: Tympanic membrane normal. No middle ear effusion.  Left Ear: Tympanic membrane normal.  No middle ear effusion.  Mouth/Throat: Uvula is midline and mucous membranes are normal. No oral lesions. No uvula swelling. No oropharyngeal exudate, posterior oropharyngeal edema, posterior oropharyngeal erythema or tonsillar abscesses. No tonsillar exudate.  Eyes: Pupils are equal, round, and reactive to light. Conjunctivae are normal. Right eye exhibits no discharge. Left eye exhibits no discharge. No scleral icterus.  Neck: Normal range of motion. Neck supple. No thyromegaly present.  Patient has bilateral cervical lymphadenopathy with tenderness.  No palpable nodes along upper chest.    Cardiovascular: Normal rate, regular rhythm, normal heart sounds and intact distal pulses. Exam reveals no gallop and no friction rub.  No murmur heard. Pulmonary/Chest: Effort normal and breath sounds normal. No respiratory distress. She has no wheezes. She has no rales. She exhibits no tenderness.  Lymphadenopathy:    She has cervical adenopathy.  Neurological: She is alert and oriented to person, place, and time.  Skin: Capillary refill takes less than 2 seconds. She is not diaphoretic.  Psychiatric: She has a normal mood and affect.  Nursing note and vitals  reviewed.    UC Treatments / Results  Labs (all labs ordered are listed, but only abnormal results are displayed) Labs Reviewed  CULTURE, GROUP A STREP [facility])  POCT RAPID STREP A  POCT INFECTIOUS MONO SCREEN    EKG None  Radiology No results found.  Procedures Procedures (including critical care time)  Medications Ordered in UC Medications  predniSONE (  DELTASONE) tablet 20 mg (20 mg Oral Given 08/07/18 1644)    Initial Impression / Assessment and Plan / UC Course  I have reviewed the triage vital signs and the nursing notes.  Pertinent labs & imaging results that were available during my care of the patient were reviewed by me and considered in my medical decision making (see chart for details).     The patient appears ill, but stoic.  Concerned with secondary sickening.  Unlikely Mumps as her immunizations are current and she denies jaw pain.  Advised we would do a throat culture and contact her if antibiotics became necessary.  Given steroids and magic mouthwash for comfort.   Final Clinical Impressions(s) / UC Diagnoses   Final diagnoses:  Pharyngitis, unspecified etiology     Discharge Instructions     Do not return to work until you are fever free for 24 hours.     ED Prescriptions    Medication Sig Dispense Auth. Provider   magic mouthwash w/lidocaine SOLN Take 5 mLs by mouth 4 (four) times daily as needed for mouth pain (Swish, gargle, and spit one to 2 teaspoonfuls every 6 hours as needed. May be swallowed if desired.). 120 mL Servando Salina, NP      Results for orders placed or performed during the hospital encounter of 08/07/18  POCT rapid strep A [facility])  Result Value Ref Range   Streptococcus, Group A Screen (Direct) NEGATIVE NEGATIVE  Infectious mono screen, POC  Result Value Ref Range   Mono Screen NEGATIVE NEGATIVE   Meds ordered this encounter  Medications  . predniSONE (DELTASONE) tablet 20 mg  . magic mouthwash  w/lidocaine SOLN    Sig: Take 5 mLs by mouth 4 (four) times daily as needed for mouth pain (Swish, gargle, and spit one to 2 teaspoonfuls every 6 hours as needed. May be swallowed if desired.).    Dispense:  120 mL    Refill:  0    Controlled Substance Prescriptions  The usual and customary discharge instructions and warnings were given.  The patient verbalizes understanding and agrees to plan of care.    [place] Controlled Substance Registry consulted? Not Applicable   Servando Salina, NP 08/07/18 1649

## 2018-08-07 NOTE — ED Triage Notes (Signed)
Pt presents with ongoing sore throat and chills.

## 2018-08-09 ENCOUNTER — Telehealth (HOSPITAL_COMMUNITY): Payer: Self-pay

## 2018-08-09 DIAGNOSIS — J029 Acute pharyngitis, unspecified: Secondary | ICD-10-CM | POA: Diagnosis not present

## 2018-08-09 DIAGNOSIS — J0100 Acute maxillary sinusitis, unspecified: Secondary | ICD-10-CM | POA: Diagnosis not present

## 2018-08-09 DIAGNOSIS — J0110 Acute frontal sinusitis, unspecified: Secondary | ICD-10-CM | POA: Diagnosis not present

## 2018-08-09 LAB — CULTURE, GROUP A STREP (THRC)

## 2018-08-09 NOTE — Telephone Encounter (Signed)
Pt is aware of results on Doxycycline from PCP, pt would like to call and see what her PCP wants her on.

## 2018-08-12 DIAGNOSIS — E282 Polycystic ovarian syndrome: Secondary | ICD-10-CM | POA: Diagnosis not present

## 2018-08-12 DIAGNOSIS — E6609 Other obesity due to excess calories: Secondary | ICD-10-CM | POA: Diagnosis not present

## 2018-08-12 DIAGNOSIS — N926 Irregular menstruation, unspecified: Secondary | ICD-10-CM | POA: Diagnosis not present

## 2018-08-12 DIAGNOSIS — R5383 Other fatigue: Secondary | ICD-10-CM | POA: Diagnosis not present

## 2018-09-15 DIAGNOSIS — G4733 Obstructive sleep apnea (adult) (pediatric): Secondary | ICD-10-CM | POA: Diagnosis not present

## 2018-09-16 DIAGNOSIS — F909 Attention-deficit hyperactivity disorder, unspecified type: Secondary | ICD-10-CM | POA: Diagnosis not present

## 2018-11-16 DIAGNOSIS — J020 Streptococcal pharyngitis: Secondary | ICD-10-CM | POA: Diagnosis not present

## 2018-11-16 DIAGNOSIS — R509 Fever, unspecified: Secondary | ICD-10-CM | POA: Diagnosis not present

## 2018-12-21 DIAGNOSIS — F319 Bipolar disorder, unspecified: Secondary | ICD-10-CM | POA: Diagnosis not present

## 2018-12-28 DIAGNOSIS — F319 Bipolar disorder, unspecified: Secondary | ICD-10-CM | POA: Diagnosis not present

## 2018-12-30 DIAGNOSIS — F3170 Bipolar disorder, currently in remission, most recent episode unspecified: Secondary | ICD-10-CM | POA: Diagnosis not present

## 2019-01-11 DIAGNOSIS — F3170 Bipolar disorder, currently in remission, most recent episode unspecified: Secondary | ICD-10-CM | POA: Diagnosis not present

## 2019-01-18 DIAGNOSIS — F3170 Bipolar disorder, currently in remission, most recent episode unspecified: Secondary | ICD-10-CM | POA: Diagnosis not present

## 2019-02-08 DIAGNOSIS — F3170 Bipolar disorder, currently in remission, most recent episode unspecified: Secondary | ICD-10-CM | POA: Diagnosis not present

## 2019-03-08 DIAGNOSIS — F3170 Bipolar disorder, currently in remission, most recent episode unspecified: Secondary | ICD-10-CM | POA: Diagnosis not present

## 2019-04-12 DIAGNOSIS — F3170 Bipolar disorder, currently in remission, most recent episode unspecified: Secondary | ICD-10-CM | POA: Diagnosis not present

## 2019-06-05 DIAGNOSIS — Z1159 Encounter for screening for other viral diseases: Secondary | ICD-10-CM | POA: Diagnosis not present

## 2019-06-05 DIAGNOSIS — J029 Acute pharyngitis, unspecified: Secondary | ICD-10-CM | POA: Diagnosis not present

## 2019-06-05 DIAGNOSIS — B349 Viral infection, unspecified: Secondary | ICD-10-CM | POA: Diagnosis not present

## 2019-09-09 ENCOUNTER — Encounter: Payer: Self-pay | Admitting: Physician Assistant

## 2019-09-09 ENCOUNTER — Ambulatory Visit: Payer: BLUE CROSS/BLUE SHIELD | Admitting: Physician Assistant

## 2019-09-09 ENCOUNTER — Other Ambulatory Visit: Payer: Self-pay

## 2019-09-09 VITALS — BP 122/80 | HR 90 | Temp 98.1°F | Ht 61.5 in | Wt 222.0 lb

## 2019-09-09 DIAGNOSIS — E282 Polycystic ovarian syndrome: Secondary | ICD-10-CM

## 2019-09-09 DIAGNOSIS — J4540 Moderate persistent asthma, uncomplicated: Secondary | ICD-10-CM

## 2019-09-09 DIAGNOSIS — Z23 Encounter for immunization: Secondary | ICD-10-CM | POA: Diagnosis not present

## 2019-09-09 DIAGNOSIS — Z111 Encounter for screening for respiratory tuberculosis: Secondary | ICD-10-CM | POA: Diagnosis not present

## 2019-09-09 DIAGNOSIS — F319 Bipolar disorder, unspecified: Secondary | ICD-10-CM | POA: Diagnosis not present

## 2019-09-09 MED ORDER — ALBUTEROL SULFATE HFA 108 (90 BASE) MCG/ACT IN AERS: 2.0000 | INHALATION_SPRAY | RESPIRATORY_TRACT | 2 refills | Status: DC | PRN

## 2019-09-09 MED ORDER — BUDESONIDE-FORMOTEROL FUMARATE 80-4.5 MCG/ACT IN AERO: 2.0000 | INHALATION_SPRAY | Freq: Two times a day (BID) | RESPIRATORY_TRACT | 3 refills | Status: DC

## 2019-09-09 NOTE — Patient Instructions (Signed)
It was great to see you!  Let's start Symbicort two puffs in AM and two puffs in PM. Albuterol as needed in between.  If still needing Albuterol more than 2 times a week, please let me know.  Take care,  Inda Coke PA-C

## 2019-09-09 NOTE — Progress Notes (Signed)
Summer Arnold is a 33 y.o. female here Establish Care.  I acted as a Neurosurgeon for [employer], PA-C Summer Mull, LPN   History of Present Illness:   Chief Complaint  Patient presents with  . Establish Care  . Asthma   Asthma   Pt would like to be on some preventive medication to help with her Asthma. Dx at age 47.  Patient has been on Advair in the past with relief of symptoms.  She is using her daughter's inhaler as needed for her cough and wheezing.  She states that she has had more seasonal issues than normal this year.  She denies any current wheezing.  She is not a smoker.  Bipolar 1 disorder She is currently followed [facility] the [facility] for this.  She is currently on Lamictal 200 mg daily.  She tolerates this well.  Denies any suicidal ideation.  Does have history of suicide attempt with overdose x2 times.  PCOS She was diagnosed in 2017 or 2018.  She had a stent of several months of amenorrhea as well as some abdominal pain that sent her to the ER.  She was encouraged to be put on metformin but she declined.  She also stated that she had a sleep study which was negative.  She has facial hair growth but does not feel like she needs interim any intervention for this.  She sees Summer Arnold and has a Nexplanon.  Health Maintenance: Immunizations -- UTD, will give Flu vaccine today Colonoscopy -- N/A Mammogram -- N/A PAP -- UTD, done 01/22/2017 NILM Bone Density -- N/A Weight -- Weight: 222 lb (100.7 kg)   Depression screen PHQ 2/9 09/09/2019  Decreased Interest 0  Down, Depressed, Hopeless 0  PHQ - 2 Score 0    No flowsheet data found.   Other providers/specialists: Patient Care Team: Summer Arnold, [place] as PCP - General (Physician Assistant)   Past Medical History:  Diagnosis Date  . Asthma   . Bipolar 1 disorder (HCC)   . Nasal fracture 2014   No surgery  . PCOS (polycystic ovarian syndrome)   . Seasonal allergic reaction   . Vaginal delivery 2015      Social History   Socioeconomic History  . Marital status: Single    Spouse name: Not on file  . Number of children: Not on file  . Years of education: Not on file  . Highest education level: Not on file  Occupational History  . Not on file  Social Needs  . Financial resource strain: Not on file  . Food insecurity    Worry: Not on file    Inability: Not on file  . Transportation needs    Medical: Not on file    Non-medical: Not on file  Tobacco Use  . Smoking status: Former Smoker    Years: 4.00  . Smokeless tobacco: Never Used  . Tobacco comment: Quit 2014  Substance and Sexual Activity  . Alcohol use: No  . Drug use: No  . Sexual activity: Not Currently    Birth control/protection: Implant  Lifestyle  . Physical activity    Days per week: Not on file    Minutes per session: Not on file  . Stress: Not on file  Relationships  . Social Musician on phone: Not on file    Gets together: Not on file    Attends religious service: Not on file    Active member of club or organization:  Not on file    Attends meetings of clubs or organizations: Not on file    Relationship status: Not on file  . Intimate partner violence    Fear of current or ex partner: Not on file    Emotionally abused: Not on file    Physically abused: Not on file    Forced sexual activity: Not on file  Other Topics Concern  . 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 WISDOM TOOTH EXTRACTION      Family History  Problem Relation Age of Onset  . Mental illness Mother   . Mental illness Father   . Mental illness Sister   . Depression Sister   . COPD Maternal Grandfather   . Hypertension Maternal Grandfather   . Hyperlipidemia Maternal Grandfather   . Heart attack Maternal Grandfather   . Osteoarthritis Paternal  Grandmother   . Cancer Paternal Grandmother [id number]       Colon cancer    Allergies  Allergen Reactions  . Aspirin   . Penicillins      Current Medications:   Current Outpatient Medications:  .  albuterol (VENTOLIN HFA) 108 (90 Base) MCG/ACT inhaler, Inhale 2 puffs into the lungs every 4 (four) hours as needed for wheezing or shortness of breath., Disp: 18 g, Rfl: 2 .  Cetirizine HCl 10 MG CAPS, Take by mouth., Disp: , Rfl:  .  etonogestrel (NEXPLANON) 68 MG IMPL implant, Inject into the skin. Inserted in 11/2017, Disp: , Rfl:  .  ibuprofen (ADVIL) 200 MG tablet, Take 800 mg by mouth every 6 (six) hours as needed., Disp: , Rfl:  .  lamoTRIgine (LAMICTAL) 100 MG tablet, Take 200 mg by mouth at bedtime. , Disp: , Rfl:  .  budesonide-formoterol (SYMBICORT) 80-4.5 MCG/ACT inhaler, Inhale 2 puffs into the lungs 2 (two) times daily., Disp: 1 Inhaler, Rfl: 3   Review of Systems:   ROS   Negative unless otherwise specified per HPI.   Vitals:   Vitals:   09/09/19 1317  BP: 122/80  Pulse: 90  Temp: 98.1 F (36.7 C)  TempSrc: Temporal  SpO2: 96%  Weight: 222 lb (100.7 kg)  Height: 5' 1.5" (1.562 m)      Body mass index is 41.27 kg/m.  Physical Exam:   Physical Exam Vitals signs and nursing note reviewed.  Constitutional:      General: She is not in acute distress.    Appearance: She is well-developed. She is not ill-appearing or toxic-appearing.  Cardiovascular:     Rate and Rhythm: Normal rate and regular rhythm.     Pulses: Normal pulses.     Heart sounds: Normal heart sounds, S1 normal and S2 normal.     Comments: No LE edema Pulmonary:     Effort: Pulmonary effort is normal.     Breath sounds: Normal breath sounds.  Skin:    General: Skin is warm and dry.  Neurological:     Mental Status: She is alert.     GCS: GCS eye subscore is 4. GCS verbal subscore is 5. GCS motor subscore is 6.  Psychiatric:        Speech: Speech normal.        Behavior: Behavior normal.  Behavior is cooperative.      Assessment and Plan:  Summer Arnold was seen today for establish care and asthma.  Diagnoses and all orders for this visit:  Moderate persistent asthma without complication Uncontrolled.  Will start Symbicort daily in a.m. and p.m.  I also prescribed her albuterol to use as needed in between.  I recommended that she follow-up with [place] if she is requiring albuterol use more than 1-2 times a week.  She has been on Singulair in the past, may need to add that.  Bipolar 1 disorder ([place]) Currently well controlled, followed by mood treatment center.  Denies any SI or HI at this time. I discussed with patient that if they develop any SI, to tell someone immediately and seek medical attention.  PCOS (polycystic ovarian syndrome) Managed per patient's report.  She declines any further intervention or work-up at this time.  Need for immunization against influenza -     Flu Vaccine QUAD 36+ mos IM  Other orders -     budesonide-formoterol (SYMBICORT) 80-4.5 MCG/ACT inhaler; Inhale 2 puffs into the lungs 2 (two) times daily. -     Discontinue: albuterol (VENTOLIN HFA) 108 (90 Base) MCG/ACT inhaler; Inhale 2 puffs into the lungs every 4 (four) hours as needed for wheezing or shortness of breath. -     albuterol (VENTOLIN HFA) 108 (90 Base) MCG/ACT inhaler; Inhale 2 puffs into the lungs every 4 (four) hours as needed for wheezing or shortness of breath.    . Reviewed expectations re: course of current medical issues. . Discussed self-management of symptoms. . Outlined signs and symptoms indicating need for more acute intervention. . Patient verbalized understanding and all questions were answered. . See orders for this visit as documented in the electronic medical record. . Patient received an After-Visit Summary.  CMA or LPN served as scribe during this visit. History, Physical, and Plan performed by medical provider. The above documentation has been reviewed and is  accurate and complete.   Inda Coke, PA-C

## 2019-09-12 DIAGNOSIS — Z111 Encounter for screening for respiratory tuberculosis: Secondary | ICD-10-CM | POA: Diagnosis not present

## 2019-11-07 ENCOUNTER — Ambulatory Visit (INDEPENDENT_AMBULATORY_CARE_PROVIDER_SITE_OTHER): Payer: Commercial Managed Care - PPO | Admitting: Physician Assistant

## 2019-11-07 ENCOUNTER — Other Ambulatory Visit: Payer: Self-pay

## 2019-11-07 ENCOUNTER — Ambulatory Visit (INDEPENDENT_AMBULATORY_CARE_PROVIDER_SITE_OTHER): Payer: Self-pay

## 2019-11-07 ENCOUNTER — Encounter: Payer: Self-pay | Admitting: Physician Assistant

## 2019-11-07 VITALS — BP 140/79 | HR 98 | Temp 98.0°F | Ht 61.0 in | Wt 231.0 lb

## 2019-11-07 DIAGNOSIS — M79671 Pain in right foot: Secondary | ICD-10-CM

## 2019-11-07 MED ORDER — DICLOFENAC SODIUM 75 MG PO TBEC: 75.0000 mg | DELAYED_RELEASE_TABLET | Freq: Two times a day (BID) | ORAL | 0 refills | Status: DC

## 2019-11-07 NOTE — Progress Notes (Signed)
Summer Arnold is a 34 y.o. female here for a new problem.   History of Present Illness:   Chief Complaint  Patient presents with  . Foot Pain    rating at pain level of 8     HPI  R foot pain Patient reports severe 8/10 pain with walking on R foot. Pain is on the top of foot. Started on Thursday, Dec 31 and has gradually worsened with time. Denies known recent trauma, but states that a space heater was dropped on her foot about 1 year ago. History of ankle sprains in this foot as well. Denies swelling, erythema, numbness, tingling.   Past Medical History:  Diagnosis Date  . Asthma   . Bipolar 1 disorder (HCC)   . Nasal fracture 2014   No surgery  . PCOS (polycystic ovarian syndrome)   . Seasonal allergic reaction   . Vaginal delivery 2015     Social History   Socioeconomic History  . Marital status: Single    Spouse name: Not on file  . Number of children: Not on file  . Years of education: Not on file  . Highest education level: Not on file  Occupational History  . Not on file  Tobacco Use  . Smoking status: Former Smoker    Years: 4.00  . Smokeless tobacco: Never Used  . Tobacco comment: Quit 2014  Substance and Sexual Activity  . Alcohol use: No  . Drug use: No  . Sexual activity: Not Currently    Birth control/protection: Implant  Other Topics Concern  . 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 Difficulty of Paying Living Expenses: Not on file  Food Insecurity:   . Worried About Programme researcher, broadcasting/film/video in the Last Year: Not on file  . Ran Out of Food in the Last Year: Not on file  Transportation Needs:   . Lack of Transportation (Medical): Not on file  . Lack of Transportation (Non-Medical): Not on file  Physical Activity:   . Days of Exercise per Week: Not on file  . Minutes of  Exercise per Session: Not on file  Stress:   . Feeling of Stress : Not on file  Social Connections:   . Frequency of Communication with Friends and Family: Not on file  . Frequency of Social Gatherings with Friends and Family: Not on file  . Attends Religious Services: Not on file  . Active Member of Clubs or Organizations: Not on file  . Attends Banker Meetings: Not on file  . Marital Status: Not on file  Intimate Partner Violence:   . Fear of Current or Ex-Partner: Not on file  . Emotionally Abused: Not on file  . Physically Abused: Not on file  . Sexually Abused: Not on file    Past Surgical History:  Procedure Laterality Date  . WISDOM TOOTH EXTRACTION      Family History  Problem Relation Age of Onset  . Mental illness Mother   . Mental illness Father   . Mental illness Sister   . Depression Sister   . COPD Maternal Grandfather   . Hypertension Maternal Grandfather   . Hyperlipidemia Maternal Grandfather   . Heart attack Maternal Grandfather   . Osteoarthritis Paternal Grandmother   . Cancer Paternal Grandmother 34  Colon cancer    Allergies  Allergen Reactions  . Aspirin   . Penicillins     Current Medications:   Current Outpatient Medications:  .  albuterol (VENTOLIN HFA) 108 (90 Base) MCG/ACT inhaler, Inhale 2 puffs into the lungs every 4 (four) hours as needed for wheezing or shortness of breath., Disp: 18 g, Rfl: 2 .  budesonide-formoterol (SYMBICORT) 80-4.5 MCG/ACT inhaler, Inhale 2 puffs into the lungs 2 (two) times daily., Disp: 1 Inhaler, Rfl: 3 .  Cetirizine HCl 10 MG CAPS, Take by mouth., Disp: , Rfl:  .  etonogestrel (NEXPLANON) 68 MG IMPL implant, Inject into the skin. Inserted in 11/2017, Disp: , Rfl:  .  ibuprofen (ADVIL) 200 MG tablet, Take 800 mg by mouth every 6 (six) hours as needed., Disp: , Rfl:  .  lamoTRIgine (LAMICTAL) 100 MG tablet, Take 200 mg by mouth at bedtime. , Disp: , Rfl:  .  diclofenac (VOLTAREN) 75 MG EC  tablet, Take 1 tablet (75 mg total) by mouth 2 (two) times daily., Disp: 30 tablet, Rfl: 0   Review of Systems:   ROS  Negative unless otherwise specified per HPI.  Vitals:   Vitals:   11/07/19 1006  BP: 140/79  Pulse: 98  Temp: 98 F (36.7 C)  SpO2: 97%  Weight: 231 lb (104.8 kg)  Height: 5\' 1"  (1.549 m)     Body mass index is 43.65 kg/m.  Physical Exam:   Physical Exam Constitutional:      Appearance: She is well-developed.  HENT:     Head: Normocephalic and atraumatic.  Eyes:     Conjunctiva/sclera: Conjunctivae normal.  Pulmonary:     Effort: Pulmonary effort is normal.  Musculoskeletal:        General: Normal range of motion.     Cervical back: Normal range of motion and neck supple.     Comments: Tenderness to palpation to dorsal aspect of 2nd-4th metatarsals. No swelling noted.  Skin:    General: Skin is warm and dry.  Neurological:     Mental Status: She is alert and oriented to person, place, and time.     Comments: Normal sensation to R foot  Psychiatric:        Behavior: Behavior normal.        Thought Content: Thought content normal.        Judgment: Judgment normal.       Assessment and Plan:   Danaka was seen today for foot pain.  Diagnoses and all orders for this visit:  Right foot pain Suspect possible foot strain. Will start oral diclofenac for inflammation and have her see Dr. Lynne Leader in 1 week, sooner if worsening pain in the interim. Recommended that she elevate and ice foot regularly. Work note provided today. -     DG Foot Complete Right; Future -     Ambulatory referral to Sports Medicine  Other orders -     diclofenac (VOLTAREN) 75 MG EC tablet; Take 1 tablet (75 mg total) by mouth 2 (two) times daily.   . Reviewed expectations re: course of current medical issues. . Discussed self-management of symptoms. . Outlined signs and symptoms indicating need for more acute intervention. . Patient verbalized understanding and all  questions were answered. . See orders for this visit as documented in the electronic medical record. . Patient received an After-Visit Summary.  CMA or LPN served as scribe during this visit. History, Physical, and Plan performed by medical provider. The above documentation  has been reviewed and is accurate and complete.   Inda Coke, PA-C

## 2019-11-07 NOTE — Patient Instructions (Signed)
It was great to see you!  Start oral diclofenac twice daily for 1 week for your foot pain. Stay off of your foot when it is causing pain.  Please follow-up with Dr. Clementeen Graham, our sports medicine doctor, on [facility] for follow-up to make sure you are healing well or if symptoms worsen. Their number: [phone] (someone should call you to schedule an appt.)  Take care,  Jarold Motto PA-C

## 2019-11-08 ENCOUNTER — Ambulatory Visit (INDEPENDENT_AMBULATORY_CARE_PROVIDER_SITE_OTHER): Payer: Commercial Managed Care - PPO | Admitting: Family Medicine

## 2019-11-08 ENCOUNTER — Ambulatory Visit (INDEPENDENT_AMBULATORY_CARE_PROVIDER_SITE_OTHER): Payer: Commercial Managed Care - PPO

## 2019-11-08 ENCOUNTER — Encounter: Payer: Self-pay | Admitting: Family Medicine

## 2019-11-08 VITALS — BP 120/78 | HR 88 | Ht 61.0 in | Wt 229.0 lb

## 2019-11-08 DIAGNOSIS — M79671 Pain in right foot: Secondary | ICD-10-CM

## 2019-11-08 NOTE — Patient Instructions (Addendum)
Thank you for coming in today.  Use the post op shoe as needed likely for a few days.  Ok to wean into a normal shoe when able.  Try using over the counter voltaren gel on the foot up to 4x daily for pain.  Recheck back with me if not improving.

## 2019-11-08 NOTE — Progress Notes (Signed)
Subjective:    I'm seeing this patient as a consultation for:  Rinaldo Cloud, [place]. Note will be routed back to referring provider/PCP.  CC: R foot pain  I, Molly Weber, LAT, ATC, am serving as scribe for Dr. Clementeen Graham.  HPI: Pt is a 34 y/o female presenting w/ c/o R dorsal foot pain since 11/03/19 w/ no known MOI.  Pt saw her PCP yesterday and was referred to Sports Medicine.  She rates her pain as an 8/10 that is aggravated w/ weight bearing.  She was prescribed oral Voltaren 75 mg bid.  Pt states that the Voltaren is helping and now rates her pain at a 5-6/10, particularly w/ walking.  She locates her foot pain to the middle of her foot and more proximally toward her MTP joints.  She notes that prior to the onset of pain she has been doing a lot of barefoot walking on a hard surface which is not typical for her.  She notes that with diclofenac gel and more supportive shoes her pain is improving quite a bit.  She cannot fit into her normal sneakers right now is a little uncomfortable off the top of her foot.  She denies any injury.  She works as a Educational psychologist.  She thinks that she could return to work in a day or 2 with limited hours for a week or so.  Past medical history, Surgical history, Family history not pertinant except as noted below, Social history, Allergies, and medications have been entered into the medical record, reviewed, and no changes needed.   Review of Systems: No headache, visual changes, nausea, vomiting, diarrhea, constipation, dizziness, abdominal pain, skin rash, fevers, chills, night sweats, weight loss, swollen lymph nodes, body aches, joint swelling, muscle aches, chest pain, shortness of breath, mood changes, visual or auditory hallucinations.   Objective:    Vitals:   11/08/19 1031  BP: 120/78  Pulse: 88  SpO2: 97%   General: Well Developed, well nourished, and in no acute distress.  Neuro/Psych: Alert and oriented x3, extra-ocular muscles intact, able  to move all 4 extremities, sensation grossly intact. Skin: Warm and dry, no rashes noted.  Respiratory: Not using accessory muscles, speaking in full sentences, trachea midline.  Cardiovascular: Pulses palpable, no extremity edema. Abdomen: Does not appear distended. MSK:  Right foot: Slightly swollen across dorsal midfoot otherwise normal-appearing with no deformity or erythema. Normal foot and ankle motion. Minimally tender palpation across third and fourth dorsal metatarsals.  Otherwise foot is nontender. Normal strength.  Pulses cap refill and sensation are intact distally.  Lab and Radiology Results No results found for this or any previous visit (from the past 72 hour(s)). DG Foot Complete Right  Result Date: 11/07/2019 CLINICAL DATA:  Pain EXAM: RIGHT FOOT COMPLETE - 3+ VIEW COMPARISON:  None. FINDINGS: Frontal, oblique, and lateral views were obtained. There is no fracture or dislocation. Joint spaces appear normal. There are posterior and inferior calcaneal spurs. IMPRESSION: Calcaneal spurs. No fracture or dislocation. No evident arthropathy. Electronically Signed   By: Bretta Bang III M.D.   On: 11/07/2019 12:48   I, Clementeen Graham, personally (independently) visualized and performed the interpretation of the images attached in this note.  Limited musculoskeletal ultrasound right dorsal foot. Mild hypoechoic fluid along cortex of third and fourth midshaft metatarsal.  No cortical defect or increased Doppler activity visible.  Foot is otherwise normal-appearing to ultrasound examination. Impression: Possible stress reaction third and fourth metatarsal.  Impression and Recommendations:  Assessment and Plan: 34 y.o. female with right foot pain and swelling.  Occurred after change in activity.  Symptoms consistent with stress reaction versus contusion.  Ultrasound examination does not show obvious stress fracture or other significant abnormalities.  Fortunately she is already  improving with a bit of conservative management.  Plan to transition to postop shoe and topical diclofenac gel.  Wean off of oral NSAIDs when able.  Work note provided to return to work in 2 days with light duty for 1 week then return to normal duty.  Recheck back with me in about a month or sooner if needed.  No need for follow-up if all better.Marland Kitchen  PDMP not reviewed this encounter. Orders Placed This Encounter  Procedures  . [place] - LOWER Extremity - Limited - RIGHT    Order Specific Question:   Reason for Exam (SYMPTOM  OR DIAGNOSIS REQUIRED)    Answer:   R foot pain    Order Specific Question:   Preferred imaging location?    Answer:   [place]   No orders of the defined types were placed in this encounter.   Discussed warning signs or symptoms. Please see discharge instructions. Patient expresses understanding.   The above documentation has been reviewed and is accurate and complete Lynne Leader

## 2019-11-15 ENCOUNTER — Other Ambulatory Visit: Payer: Commercial Managed Care - PPO

## 2019-12-16 ENCOUNTER — Other Ambulatory Visit: Payer: Self-pay | Admitting: Physician Assistant

## 2019-12-16 NOTE — Telephone Encounter (Signed)
Samantha okay to refill Diclofenac?

## 2020-03-20 IMAGING — DX DG FOOT COMPLETE 3+V*R*
3 series · 3 of 3 positions shown · non-contrast
Comparison: None.

CLINICAL DATA: Pain

EXAM:
RIGHT FOOT COMPLETE - 3+ VIEW

[foot dp]
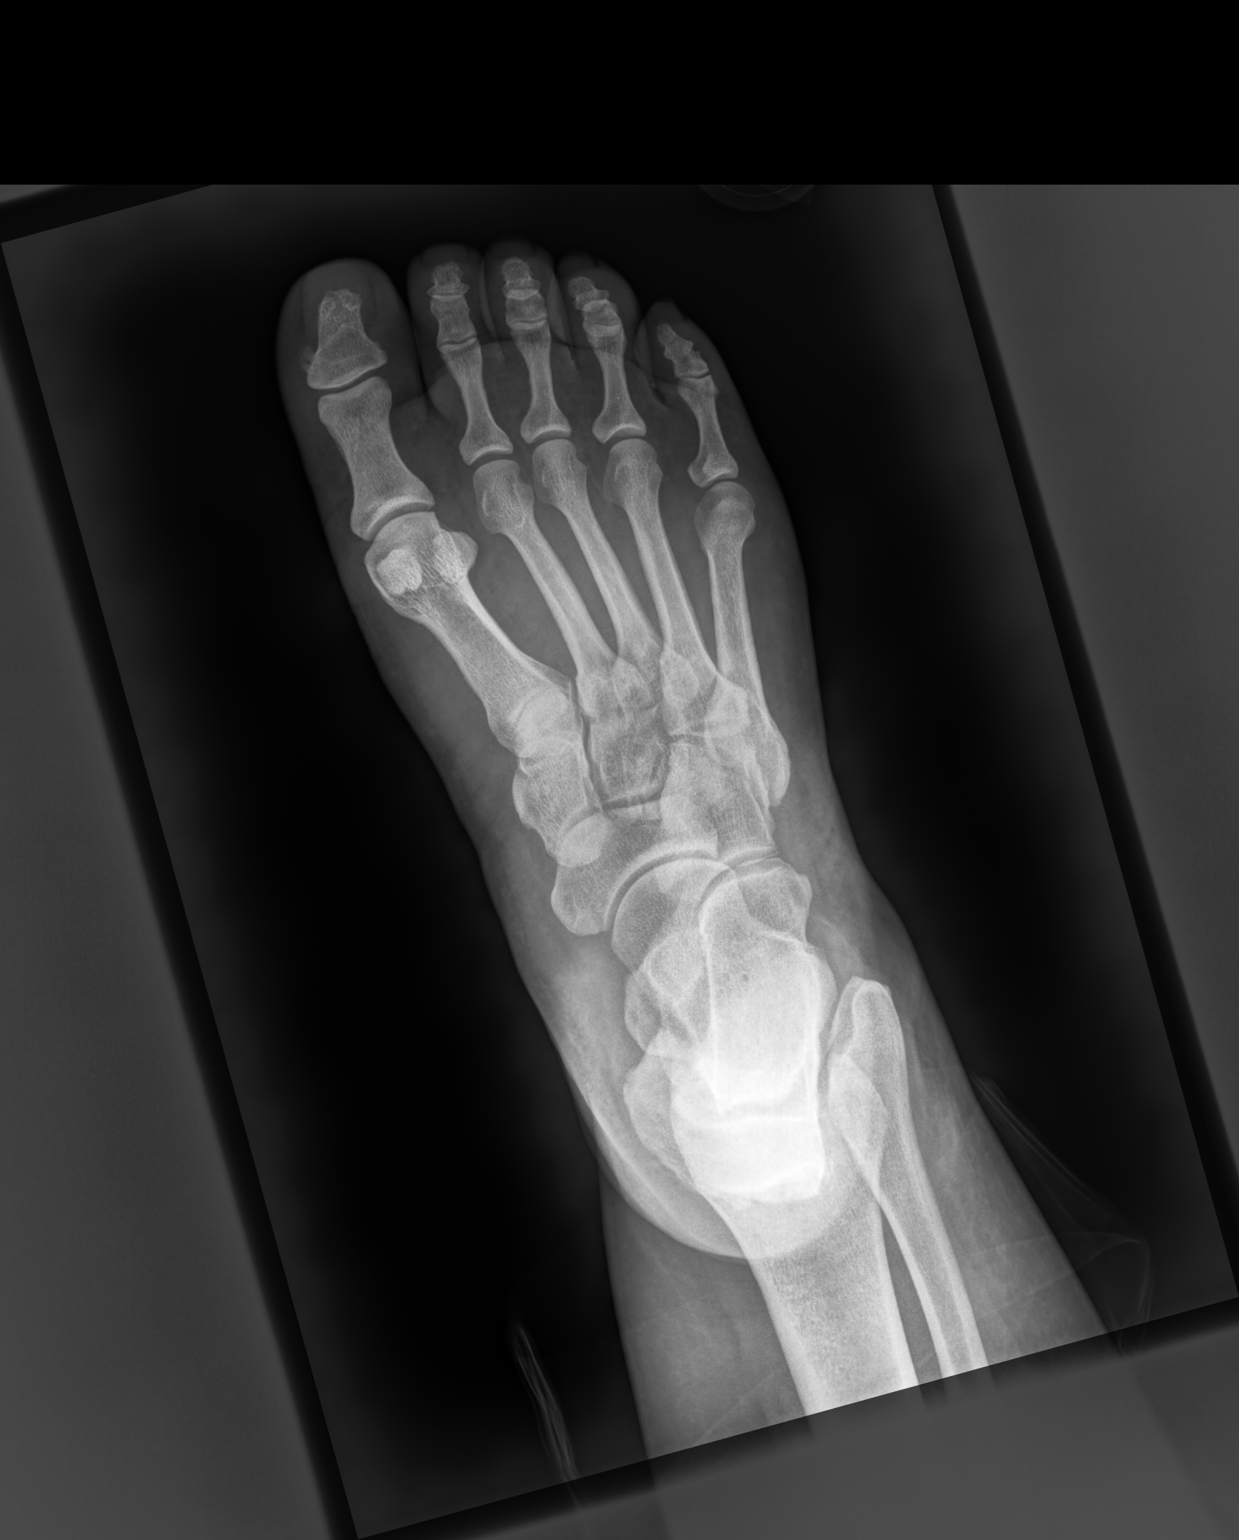

[foot oblique]
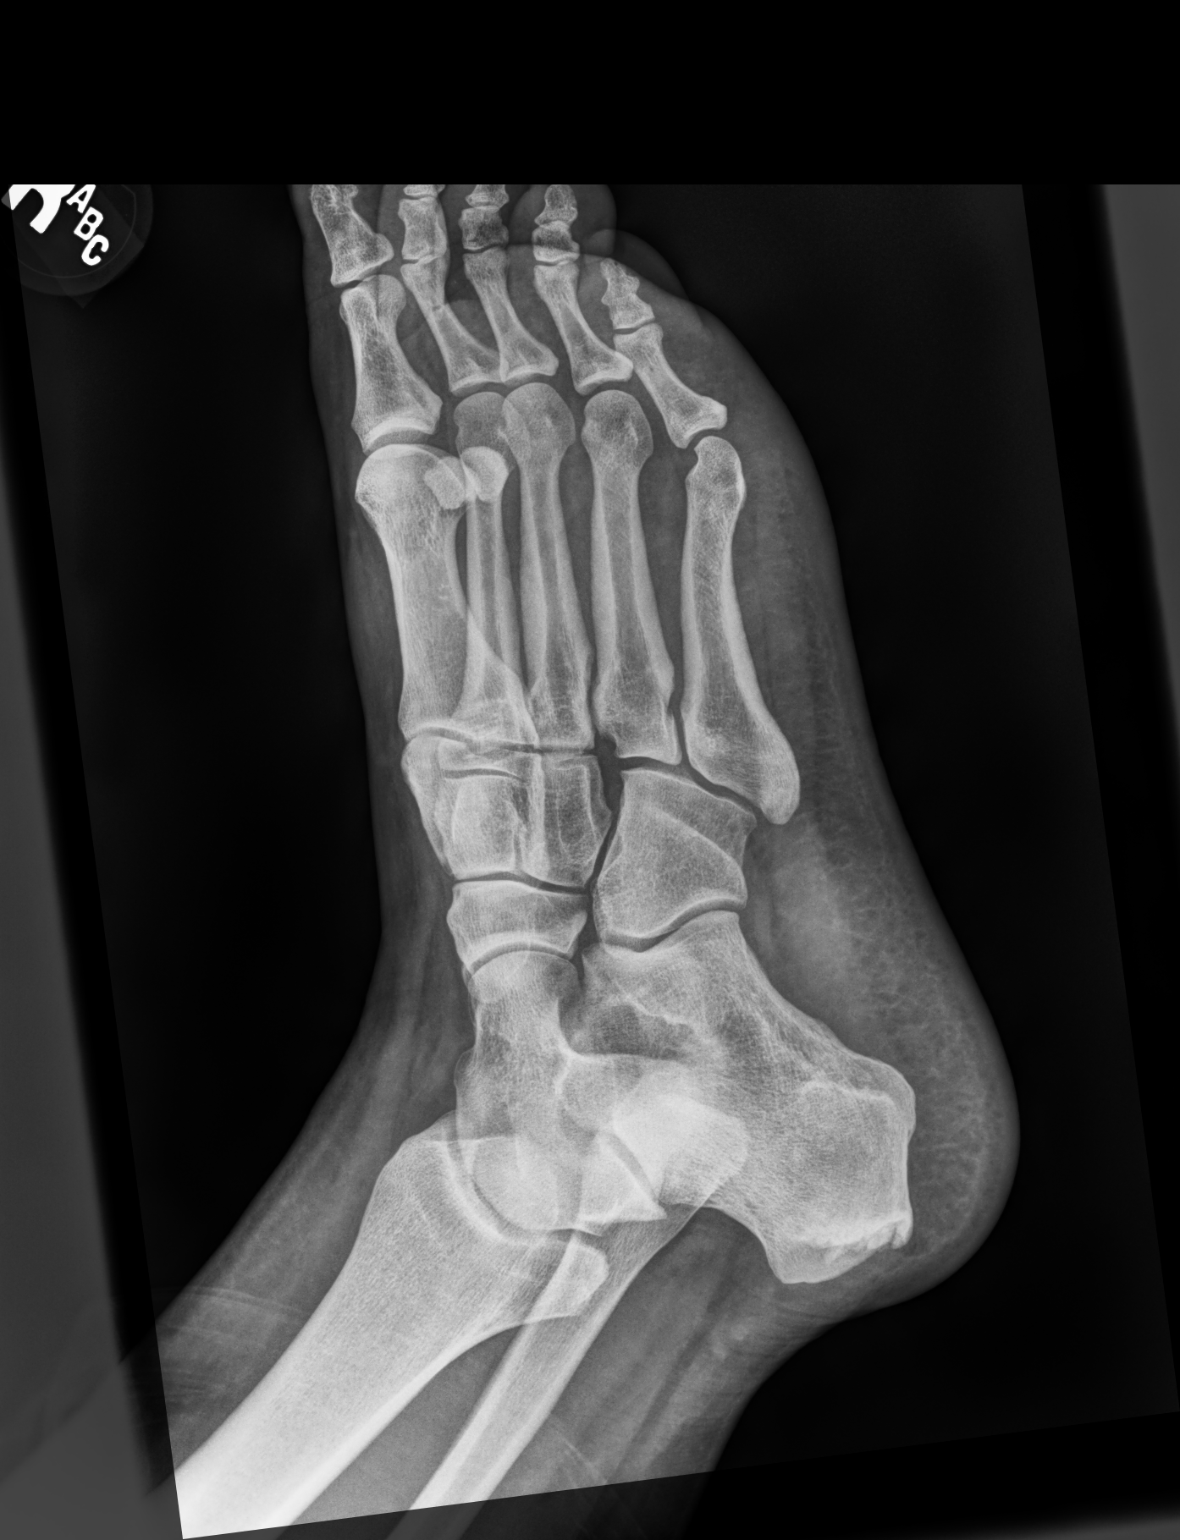

[foot lat]
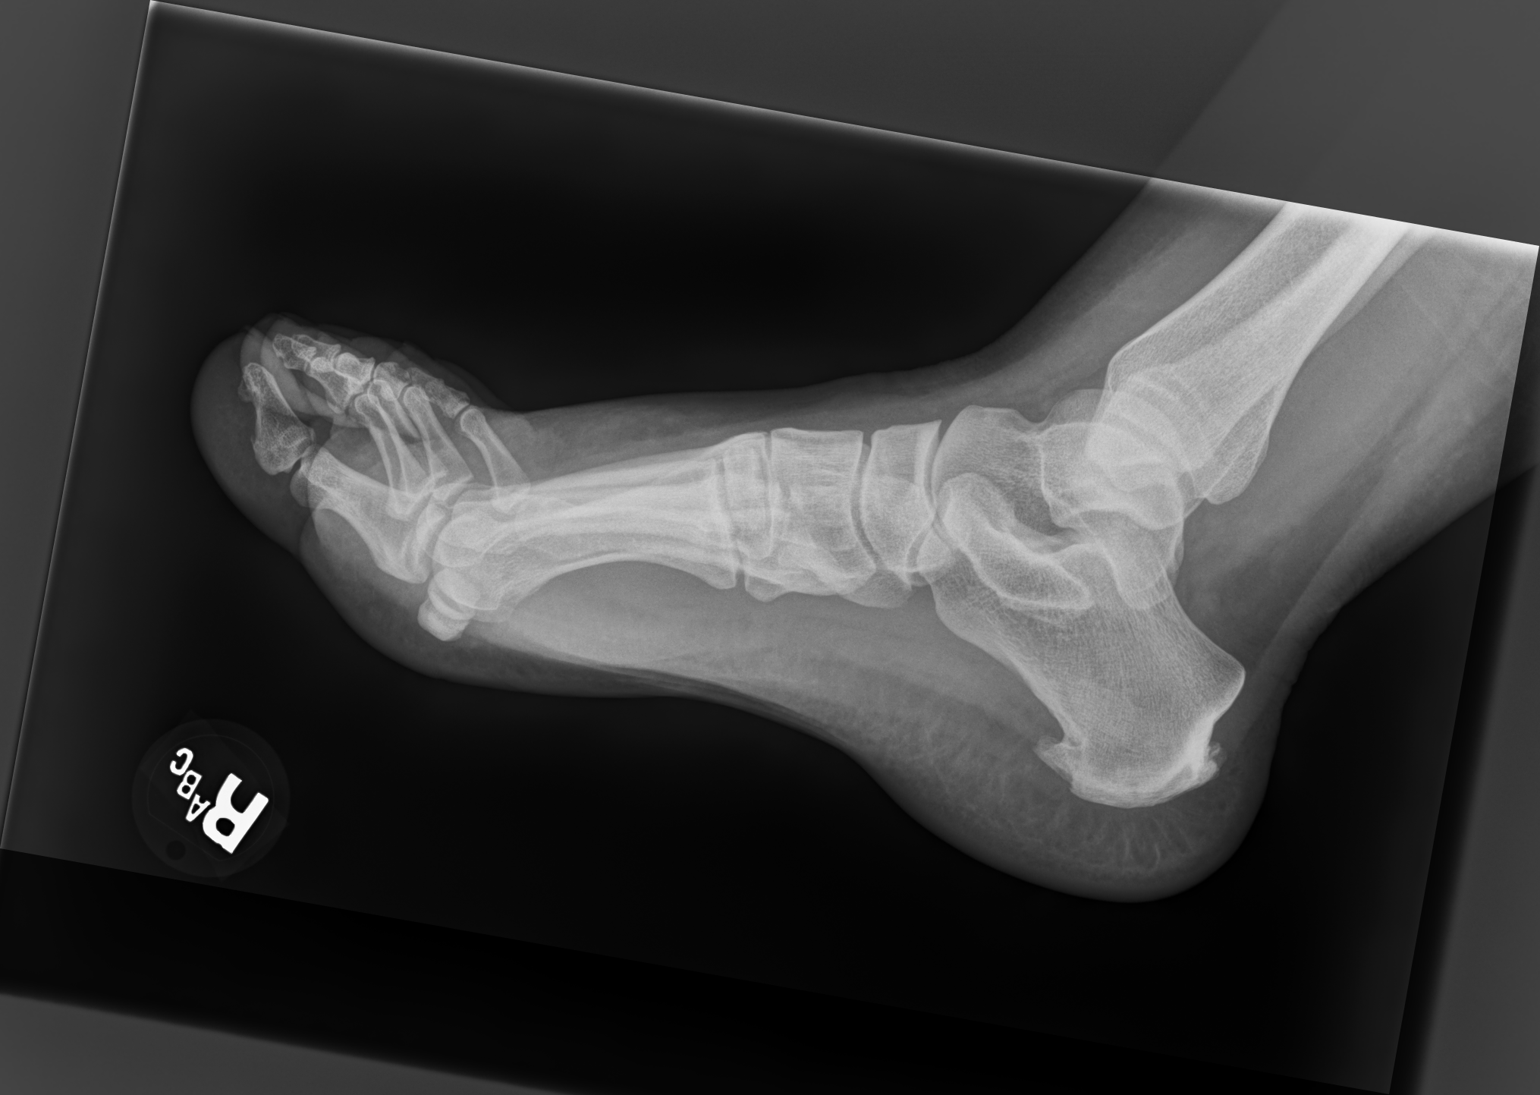

[3 of 3 positions shown; findings below may reference images not displayed]

FINDINGS: Frontal, oblique, and lateral views were obtained. There is no
fracture or dislocation. Joint spaces appear normal. There are
posterior and inferior calcaneal spurs.
IMPRESSION: Calcaneal spurs. No fracture or dislocation. No evident arthropathy.

## 2020-04-17 ENCOUNTER — Telehealth: Payer: Self-pay | Admitting: Physician Assistant

## 2020-04-17 MED ORDER — MONTELUKAST SODIUM 10 MG PO TABS: 10.0000 mg | ORAL_TABLET | Freq: Every day | ORAL | 1 refills | Status: DC

## 2020-04-17 NOTE — Telephone Encounter (Signed)
Patient is calling in stating that she got sick a few weeks ago and her rescue inhaler SYMBICORT did not help but was prescribed singulair inhaler so patient is asking if she could switch to that.

## 2020-04-17 NOTE — Telephone Encounter (Signed)
Spoke to pt told her will send Rx for Singulair to the pharmacy for her. Pt verbalized understanding.

## 2020-04-17 NOTE — Telephone Encounter (Signed)
Please see message and advise if okay to send in Rx for Singulair or does pt need an appt?

## 2020-04-17 NOTE — Telephone Encounter (Signed)
Ok to send in [place].

## 2020-04-17 NOTE — Telephone Encounter (Signed)
Spoke to pt. She is having a flare with her allergies and asthma and would like to know if we could send in a Rx for Singulair which works well for her or if she needs to be seen. Told pt I will check with Lelon Mast and get back to her. Pt verbalized understanding.

## 2020-08-06 ENCOUNTER — Other Ambulatory Visit: Payer: Self-pay | Admitting: Physician Assistant

## 2020-12-04 DIAGNOSIS — F3111 Bipolar disorder, current episode manic without psychotic features, mild: Secondary | ICD-10-CM | POA: Diagnosis not present

## 2020-12-04 DIAGNOSIS — F3131 Bipolar disorder, current episode depressed, mild: Secondary | ICD-10-CM | POA: Diagnosis not present

## 2020-12-06 ENCOUNTER — Other Ambulatory Visit: Payer: Self-pay

## 2020-12-06 ENCOUNTER — Ambulatory Visit
Admission: RE | Admit: 2020-12-06 | Discharge: 2020-12-06 | Disposition: A | Discharge status: Home / Self Care | Payer: BC Managed Care – PPO | Source: Ambulatory Visit | Attending: Family Medicine | Admitting: Family Medicine

## 2020-12-06 VITALS — BP 137/76 | HR 113 | Temp 98.4°F | Resp 20

## 2020-12-06 DIAGNOSIS — Z20822 Contact with and (suspected) exposure to covid-19: Secondary | ICD-10-CM | POA: Diagnosis not present

## 2020-12-06 DIAGNOSIS — Z03818 Encounter for observation for suspected exposure to other biological agents ruled out: Secondary | ICD-10-CM | POA: Diagnosis not present

## 2020-12-06 DIAGNOSIS — J069 Acute upper respiratory infection, unspecified: Secondary | ICD-10-CM | POA: Diagnosis not present

## 2020-12-06 DIAGNOSIS — Z1152 Encounter for screening for COVID-19: Secondary | ICD-10-CM

## 2020-12-06 MED ORDER — LIDOCAINE VISCOUS HCL 2 % MT SOLN: 15.0000 mL | OROMUCOSAL | 0 refills | Status: DC | PRN

## 2020-12-06 MED ORDER — MONTELUKAST SODIUM 10 MG PO TABS: ORAL_TABLET | ORAL | 1 refills | Status: DC

## 2020-12-06 NOTE — ED Provider Notes (Signed)
[facility]    CSN: [id number] Arrival date & time: 12/06/20  0900      History   Chief Complaint Chief Complaint  Patient presents with  . appt@ 9- High Risk-- Sore Throat/Fever    HPI Summer Arnold is a 35 y.o. female.   HPI  Patient presents with URI symptoms including sore throat, fever, fatigue, HA. No known of COVID exposure. COVID Vaccinated: Y Denies worrisome symptoms of shortness of breath, weakness, N&V,or chest pain. Patient has history of asthma although denies any current exacerbation. Past Medical History:  Diagnosis Date  . Asthma   . Bipolar 1 disorder (HCC)   . Nasal fracture 2014   No surgery  . PCOS (polycystic ovarian syndrome)   . Seasonal allergic reaction   . Vaginal delivery 2015    Patient Active Problem List   Diagnosis Date Noted  . Asthma 09/05/2013  . Bipolar 2 disorder (HCC) 09/05/2013  . History of herpes genitalis 09/05/2013  . Seasonal allergies 09/05/2013  . Nasal fracture 08/03/2013    Past Surgical History:  Procedure Laterality Date  . WISDOM TOOTH EXTRACTION      OB History    Gravida  1   Para      Term      Preterm      AB      Living        SAB      IAB      Ectopic      Multiple      Live Births               Home Medications    Prior to Admission medications   Medication Sig Start Date End Date Taking? Authorizing Provider  albuterol (VENTOLIN HFA) 108 (90 Base) MCG/ACT inhaler Inhale 2 puffs into the lungs every 4 (four) hours as needed for wheezing or shortness of breath. 09/09/19   Jarold Motto, PA  budesonide-formoterol (SYMBICORT) 80-4.5 MCG/ACT inhaler Inhale 2 puffs into the lungs 2 (two) times daily. 09/09/19   Jarold Motto, PA  Cetirizine HCl 10 MG CAPS Take by mouth.    [provider]  diclofenac (VOLTAREN) 75 MG EC tablet TAKE 1 TABLET BY MOUTH TWICE A DAY 12/16/19   Jarold Motto, PA  etonogestrel (NEXPLANON) 68 MG IMPL implant Inject into the skin.  Inserted in 11/2017    [provider]  ibuprofen (ADVIL) 200 MG tablet Take 800 mg by mouth every 6 (six) hours as needed.    [provider]  lamoTRIgine (LAMICTAL) 100 MG tablet Take 200 mg by mouth at bedtime.     [provider]  montelukast (SINGULAIR) 10 MG tablet TAKE 1 TABLET BY MOUTH EVERYDAY AT BEDTIME 08/06/20   Jarold Motto, PA    Family History Family History  Problem Relation Age of Onset  . Mental illness Mother   . Mental illness Father   . Mental illness Sister   . Depression Sister   . COPD Maternal Grandfather   . Hypertension Maternal Grandfather   . Hyperlipidemia Maternal Grandfather   . Heart attack Maternal Grandfather   . Osteoarthritis Paternal Grandmother   . Cancer Paternal Grandmother 29       Colon cancer    Social History Social History   Tobacco Use  . Smoking status: Former Smoker    Years: 4.00  . Smokeless tobacco: Never Used  . Tobacco comment: Quit 2014  Vaping Use  . Vaping Use: Never used  Substance Use Topics  . Alcohol use: No  . Drug use: No     Allergies   Aspirin and Penicillins   Review of Systems Review of Systems Pertinent negatives listed in HPI  Physical Exam Triage Vital Signs ED Triage Vitals [12/06/20 0919]  Enc Vitals Group     BP 137/76     Pulse Rate (!) 113     Resp 20     Temp 98.4 F (36.9 C)     Temp Source Oral     SpO2 96 %     Weight      Height      Head Circumference      Peak Flow      Pain Score      Pain Loc      Pain Edu?      Excl. in GC?    No data found.  Updated Vital Signs BP 137/76 (BP Location: Left Arm)   Pulse (!) 113   Temp 98.4 F (36.9 C) (Oral)   Resp 20   SpO2 96%   Visual Acuity Right Eye Distance:   Left Eye Distance:   Bilateral Distance:    Right Eye Near:   Left Eye Near:    Bilateral Near:     Physical Exam  General Appearance:    Alert, cooperative, no distress  HENT:   Normocephalic, ears normal, nares mucosal  edema with congestion, oropharynx mild erythema present    Eyes:    PERRL, conjunctiva/corneas clear, EOM's intact       Lungs:     Clear to auscultation bilaterally, respirations unlabored  Heart:    Regular rate and rhythm  Neurologic:   Awake, alert, oriented x 3. No apparent focal neurological           defect.    UC Treatments / Results  Labs (all labs ordered are listed, but only abnormal results are displayed) Labs Reviewed - No data to display  EKG   Radiology No results found.  Procedures Procedures (including critical care time)  Medications Ordered in UC Medications - No data to display  Initial Impression / Assessment and Plan / UC Course  I have reviewed the triage vital signs and the nursing notes.  Pertinent labs & imaging results that were available during my care of the patient were reviewed by me and considered in my medical decision making (see chart for details).    COVID test pending. Symptom management warranted only.  Manage fever with Tylenol and ibuprofen.  Nasal symptoms with over-the-counter antihistamines recommended.  Treatment per discharge medications/discharge instructions.  Red flags/ER precautions given. The most current CDC isolation/quarantine recommendation advised.  Final Clinical Impressions(s) / UC Diagnoses   Final diagnoses:  Viral URI with cough  Encounter for screening for COVID-19     Discharge Instructions     Your COVID 19 results should result within 3-5 days. Negative results are immediately resulted to Mychart. Positive results will receive a follow-up call from our clinic. If symptoms are present, I recommend home quarantine until results are known.  Alternate Tylenol and ibuprofen as needed for body aches and fever.  Symptom management per recommendations discussed today.  If any breathing difficulty or chest pain develops go immediately to the closest emergency department for evaluation.     ED Prescriptions     Medication Sig Dispense Auth. Provider   montelukast (SINGULAIR) 10 MG tablet TAKE 1 TABLET BY MOUTH EVERYDAY AT BEDTIME 90 tablet Joaquin Courts  S, FNP   lidocaine (XYLOCAINE) 2 % solution Use as directed 15 mLs in the mouth or throat every 3 (three) hours as needed for mouth pain (Mix with warm salt water gargle and spit). 100 mL Bing Neighbors, FNP     PDMP not reviewed this encounter.   Bing Neighbors, FNP 12/07/20 1316

## 2020-12-06 NOTE — Discharge Instructions (Addendum)
Your COVID 19 results should result within 3-5 days. °Negative results are immediately resulted to Mychart.  ° °Positive results will receive a follow-up call from our clinic. If symptoms are present, I recommend home quarantine until results are known.  ° °Alternate Tylenol and ibuprofen as needed for body aches and fever.  Symptom management per recommendations discussed today.  If any breathing difficulty or chest pain develops go immediately to the closest emergency department for evaluation.  °

## 2020-12-07 LAB — NOVEL CORONAVIRUS, NAA: SARS-CoV-2, NAA: NOT DETECTED

## 2020-12-07 LAB — SARS-COV-2, NAA 2 DAY TAT

## 2020-12-11 DIAGNOSIS — Z20822 Contact with and (suspected) exposure to covid-19: Secondary | ICD-10-CM | POA: Diagnosis not present

## 2020-12-17 DIAGNOSIS — E559 Vitamin D deficiency, unspecified: Secondary | ICD-10-CM | POA: Diagnosis not present

## 2020-12-17 DIAGNOSIS — D529 Folate deficiency anemia, unspecified: Secondary | ICD-10-CM | POA: Diagnosis not present

## 2020-12-17 DIAGNOSIS — E785 Hyperlipidemia, unspecified: Secondary | ICD-10-CM | POA: Diagnosis not present

## 2020-12-17 DIAGNOSIS — Z79899 Other long term (current) drug therapy: Secondary | ICD-10-CM | POA: Diagnosis not present

## 2020-12-17 LAB — LIPID PANEL
Cholesterol: 178 (ref 0–200)
HDL: 35 (ref 35–70)
LDL Cholesterol: 112
Triglycerides: 194 — AB (ref 40–160)

## 2020-12-17 LAB — BASIC METABOLIC PANEL
BUN: 14 (ref 4–21)
CO2: 25 — AB (ref 13–22)
Chloride: 105 (ref 99–108)
Creatinine: 0.7 (ref 0.5–1.1)
Glucose: 131
Potassium: 3.9 (ref 3.4–5.3)
Sodium: 140 (ref 137–147)

## 2020-12-17 LAB — CBC AND DIFFERENTIAL
HCT: 46 (ref 36–46)
Hemoglobin: 15.2 (ref 12.0–16.0)
Neutrophils Absolute: 4814
Platelets: 245 (ref 150–399)
WBC: 9.1

## 2020-12-17 LAB — COMPREHENSIVE METABOLIC PANEL
Albumin: 4.4 (ref 3.5–5.0)
Calcium: 9.6 (ref 8.7–10.7)
GFR calc non Af Amer: 115
Globulin: 2.3

## 2020-12-17 LAB — HEPATIC FUNCTION PANEL
ALT: 30 (ref 7–35)
AST: 22 (ref 13–35)
Alkaline Phosphatase: 77 (ref 25–125)
Bilirubin, Total: 0.3

## 2020-12-17 LAB — CBC: RBC: 5.28 — AB (ref 3.87–5.11)

## 2020-12-17 LAB — VITAMIN D 25 HYDROXY (VIT D DEFICIENCY, FRACTURES): Vit D, 25-Hydroxy: 12

## 2020-12-17 LAB — VITAMIN B12: Vitamin B-12: 293

## 2020-12-25 ENCOUNTER — Ambulatory Visit (INDEPENDENT_AMBULATORY_CARE_PROVIDER_SITE_OTHER): Payer: BC Managed Care – PPO | Admitting: Physician Assistant

## 2020-12-25 ENCOUNTER — Encounter: Payer: Self-pay | Admitting: Physician Assistant

## 2020-12-25 ENCOUNTER — Other Ambulatory Visit (HOSPITAL_COMMUNITY)
Admission: RE | Admit: 2020-12-25 | Discharge: 2020-12-25 | Disposition: A | Discharge status: Home / Self Care | Payer: BC Managed Care – PPO | Source: Ambulatory Visit | Attending: Physician Assistant | Admitting: Physician Assistant

## 2020-12-25 ENCOUNTER — Other Ambulatory Visit: Payer: Self-pay

## 2020-12-25 VITALS — BP 130/80 | HR 100 | Temp 97.7°F | Ht 61.0 in | Wt 274.0 lb

## 2020-12-25 DIAGNOSIS — E282 Polycystic ovarian syndrome: Secondary | ICD-10-CM

## 2020-12-25 DIAGNOSIS — E669 Obesity, unspecified: Secondary | ICD-10-CM | POA: Diagnosis not present

## 2020-12-25 DIAGNOSIS — Z0000 Encounter for general adult medical examination without abnormal findings: Secondary | ICD-10-CM

## 2020-12-25 DIAGNOSIS — F319 Bipolar disorder, unspecified: Secondary | ICD-10-CM | POA: Diagnosis not present

## 2020-12-25 DIAGNOSIS — Z1159 Encounter for screening for other viral diseases: Secondary | ICD-10-CM | POA: Diagnosis not present

## 2020-12-25 DIAGNOSIS — Z114 Encounter for screening for human immunodeficiency virus [HIV]: Secondary | ICD-10-CM

## 2020-12-25 DIAGNOSIS — Z124 Encounter for screening for malignant neoplasm of cervix: Secondary | ICD-10-CM

## 2020-12-25 DIAGNOSIS — E559 Vitamin D deficiency, unspecified: Secondary | ICD-10-CM

## 2020-12-25 DIAGNOSIS — J4540 Moderate persistent asthma, uncomplicated: Secondary | ICD-10-CM

## 2020-12-25 LAB — HEMOGLOBIN A1C: Hgb A1c MFr Bld: 5.5 % (ref 4.6–6.5)

## 2020-12-25 MED ORDER — MONTELUKAST SODIUM 10 MG PO TABS: ORAL_TABLET | ORAL | 1 refills | Status: DC

## 2020-12-25 MED ORDER — SPIRONOLACTONE 25 MG PO TABS: 25.0000 mg | ORAL_TABLET | Freq: Every day | ORAL | 1 refills | Status: DC

## 2020-12-25 NOTE — Progress Notes (Signed)
I acted as a Neurosurgeon for [employer], PA-C Corky Mull, LPN   Subjective:    Summer Arnold is a 35 y.o. female and is here for a comprehensive physical exam.  HPI  Health Maintenance Due  Topic Date Due  . Hepatitis C Screening  Never done  . PAP SMEAR-Modifier  01/23/2020    Acute Concerns: None  Chronic Issues: Bipolar 1 -- currently on lamictal and lithium, recently restarted by Dr. Evelene Croon. She does not have a therapist yet, but planning to see one soon.  Asthma -- taking singulair daily. Albuterol prn. Feels well controlled.  PCOS -- is on nexplanon (due to elevated testosterone). Was seeing [facility]. 165 lb 7 years ago after delivering daughter. Has had steady weight gain since. Had some issues with irregular periods, unusual pelvic pain was diagnosed with PCOS around 4 years ago. Has hair growth and confirmed PCOS on u/s. Saw endocrinology and was given rx for metformin but never took this.   Vit D deficiency -- checked by Dr. Evelene Croon, level is 12. She started 100,000 IU vit D weekly x 4 weeks.  Health Maintenance: Immunizations -- UTD Colonoscopy -- N/A Mammogram -- N/A PAP -- overdue Bone Density -- N/A Diet -- variable; is drinking soda regularly  Sleep habits -- not great with current uncontrolled bipolar disorder Exercise -- none currently Current Weight -- Weight: 274 lb (124.3 kg)  Weight History: Wt Readings from Last 10 Encounters:  12/25/20 274 lb (124.3 kg)  11/08/19 229 lb (103.9 kg)  11/07/19 231 lb (104.8 kg)  09/09/19 222 lb (100.7 kg)  11/12/13 185 lb (83.9 kg)   Body mass index is 51.77 kg/m. Mood -- working with psych; denies SI/HI No LMP recorded. Patient has had an implant.   Depression screen [facility] 2/9 12/25/2020  Decreased Interest 1  Down, Depressed, Hopeless 0  PHQ - 2 Score 1  Altered sleeping 2  Tired, decreased energy 2  Change in appetite 2  Feeling bad or failure about yourself  2  Trouble concentrating 0  Moving  slowly or fidgety/restless 1  Suicidal thoughts 0  PHQ-9 Score 10  Difficult doing work/chores Somewhat difficult     Other providers/specialists: Patient Care Team: Jarold Motto, [place] as PCP - General (Physician Assistant) Milagros Evener, MD as Consulting Physician (Psychiatry)   PMHx, SurgHx, SocialHx, Medications, and Allergies were reviewed in the Visit Navigator and updated as appropriate.   Past Medical History:  Diagnosis Date  . Asthma   . Bipolar 1 disorder (HCC)   . Nasal fracture 2014   No surgery  . PCOS (polycystic ovarian syndrome)   . Seasonal allergic reaction   . Vaginal delivery 2015     Past Surgical History:  Procedure Laterality Date  . WISDOM TOOTH EXTRACTION       Family History  Problem Relation Age of Onset  . Mental illness Mother   . Mental illness Father   . Mental illness Sister   . Depression Sister   . COPD Maternal Grandfather   . Hypertension Maternal Grandfather   . Hyperlipidemia Maternal Grandfather   . Heart attack Maternal Grandfather   . Osteoarthritis Paternal Grandmother   . Cancer Paternal Grandmother 42       Colon cancer    Social History   Tobacco Use  . Smoking status: Former Smoker    Years: 4.00  . Smokeless tobacco: Never Used  . Tobacco comment: Quit 2014  Vaping Use  . Vaping Use: Never  used  Substance Use Topics  . Alcohol use: No  . Drug use: No    Review of Systems:   Review of Systems  Constitutional: Negative.  Negative for chills, fever, malaise/fatigue and weight loss.  HENT: Negative.  Negative for hearing loss, sinus pain and sore throat.   Eyes: Negative.  Negative for blurred vision.  Respiratory: Negative.  Negative for cough and shortness of breath.   Cardiovascular: Positive for leg swelling (ankles and feet). Negative for chest pain and palpitations.  Gastrointestinal: Negative for abdominal pain, constipation, diarrhea, nausea and vomiting.  Genitourinary: Negative.  Negative for  dysuria, frequency and urgency.  Musculoskeletal: Negative.  Negative for back pain, myalgias and neck pain.  Skin: Negative.  Negative for itching and rash.  Neurological: Positive for tingling. Negative for dizziness, seizures, loss of consciousness and headaches.  Endo/Heme/Allergies: Negative for polydipsia.  Psychiatric/Behavioral: Negative for depression. The patient is not nervous/anxious.     Objective:   BP 130/80 (BP Location: Left Arm, Patient Position: Sitting, Cuff Size: Large)   Pulse 100   Temp 97.7 F (36.5 C) (Temporal)   Ht 5\' 1"  (1.549 m)   Wt 274 lb (124.3 kg)   SpO2 95%   Breastfeeding No   BMI 51.77 kg/m   General Appearance:    Alert, cooperative, no distress, appears stated age  Head:    Normocephalic, without obvious abnormality, atraumatic  Eyes:    PERRL, conjunctiva/corneas clear, EOM's intact, fundi    benign, both eyes  Ears:    Normal TM's and external ear canals, both ears  Nose:   Nares normal, septum midline, mucosa normal, no drainage    or sinus tenderness  Throat:   Lips, mucosa, and tongue normal; teeth and gums normal  Neck:   Supple, symmetrical, trachea midline, no adenopathy;    thyroid:  no enlargement/tenderness/nodules; no carotid   bruit or JVD  Back:     Symmetric, no curvature, ROM normal, no CVA tenderness  Lungs:     Clear to auscultation bilaterally, respirations unlabored  Chest Wall:    No tenderness or deformity   Heart:    Regular rate and rhythm, S1 and S2 normal, no murmur, rub   or gallop  Breast Exam:    No tenderness, masses, or nipple abnormality  Abdomen:     Soft, non-tender, bowel sounds active all four quadrants,    no masses, no organomegaly  Genitalia:    Normal female without lesion, discharge or tenderness  Rectal:    Normal tone no masses or tenderness  Extremities:   Extremities normal, atraumatic, no cyanosis or edema  Pulses:   2+ and symmetric all extremities  Skin:   Skin color, texture, turgor  normal, no rashes or lesions  Lymph nodes:   Cervical, supraclavicular, and axillary nodes normal  Neurologic:   CNII-XII intact, normal strength, sensation and reflexes    throughout    Assessment/Plan:   Jason was seen today for annual exam.  Diagnoses and all orders for this visit:  Routine physical examination Today patient counseled on age appropriate routine health concerns for screening and prevention, each reviewed and up to date or declined. Immunizations reviewed and up to date or declined. Labs ordered and reviewed. Risk factors for depression reviewed and negative. Hearing function and visual acuity are intact. ADLs screened and addressed as needed. Functional ability and level of safety reviewed and appropriate. Education, counseling and referrals performed based on assessed risks today. Patient provided with a  copy of personalized plan for preventive services.  PCOS (polycystic ovarian syndrome); Obesity, unspecified classification, unspecified obesity type, unspecified whether serious comorbidity present Update A1c today. Labs reviewed from Dr. Carie Caddy office without significant abnormalities. Will start 25 mg spironolactone 25 mg daily. Recheck BMP for K+ in 4 weeks. Likely also add metformin, dose depending on HgbA1c. Stop all sweet drinks! -     Hemoglobin A1c  Bipolar 1 disorder (HCC) Management per psych.  Vitamin D deficiency Currently managed by psych, but she would like [place] to recheck this in 4 weeks.   Asthma Currently well controlled with Singulair 10 mg daily and albuterol prn. Continue to follow as needed.  Pap smear for cervical cancer screening -     Cytology - PAP  Screening for HIV (human immunodeficiency virus) -     HIV Antibody (routine testing w rflx)  Encounter for screening for other viral diseases -     Hepatitis C antibody  Other orders -     montelukast (SINGULAIR) 10 MG tablet; TAKE 1 TABLET BY MOUTH EVERYDAY AT BEDTIME -      spironolactone (ALDACTONE) 25 MG tablet; Take 1 tablet (25 mg total) by mouth daily.    Well Adult Exam: Labs ordered: Yes. Patient counseling was done. See below for items discussed. Discussed the patient's BMI. The BMI is not in the acceptable range; BMI management plan is completed Follow up in 4 weeks.  Patient Counseling:   [x]     Nutrition: Stressed importance of moderation in sodium/caffeine intake, saturated fat and cholesterol, caloric balance, sufficient intake of fresh fruits, vegetables, fiber, calcium, iron, and 1 mg of folate supplement per day (for females capable of pregnancy).   [x]      Stressed the importance of regular exercise.    [x]     Substance Abuse: Discussed cessation/primary prevention of tobacco, alcohol, or other drug use; driving or other dangerous activities under the influence; availability of treatment for abuse.    [x]      Injury prevention: Discussed safety belts, safety helmets, smoke detector, smoking near bedding or upholstery.    [x]      Sexuality: Discussed sexually transmitted diseases, partner selection, use of condoms, avoidance of unintended pregnancy  and contraceptive alternatives.    [x]     Dental health: Discussed importance of regular tooth brushing, flossing, and dental visits.   [x]      Health maintenance and immunizations reviewed. Please refer to Health maintenance section.   CMA or LPN served as scribe during this visit. History, Physical, and Plan performed by medical provider. The above documentation has been reviewed and is accurate and complete.  , PA-C [facility]

## 2020-12-25 NOTE — Patient Instructions (Signed)
It was great to see you!  Follow-up in 4 weeks for lab only appointment -- we will update vitamin D and recheck your potassium.  Start spironolactone 25 mg daily.  I will send in metformin after I get your blood work back.  Use your pill box!  Work on reducing your soda intake.  Please go to the lab for blood work.   Our office will call you with your results unless you have chosen to receive results via MyChart.  If your blood work is normal we will follow-up each year for physicals and as scheduled for chronic medical problems.  If anything is abnormal we will treat accordingly and get you in for a follow-up.  Take care,  [facility] Maintenance, Female Adopting a healthy lifestyle and getting preventive care are important in promoting health and wellness. Ask your health care provider about:  The right schedule for you to have regular tests and exams.  Things you can do on your own to prevent diseases and keep yourself healthy. What should I know about diet, weight, and exercise? Eat a healthy diet  Eat a diet that includes plenty of vegetables, fruits, low-fat dairy products, and lean protein.  Do not eat a lot of foods that are high in solid fats, added sugars, or sodium.   Maintain a healthy weight Body mass index (BMI) is used to identify weight problems. It estimates body fat based on height and weight. Your health care provider can help determine your BMI and help you achieve or maintain a healthy weight. Get regular exercise Get regular exercise. This is one of the most important things you can do for your health. Most adults should:  Exercise for at least 150 minutes each week. The exercise should increase your heart rate and make you sweat (moderate-intensity exercise).  Do strengthening exercises at least twice a week. This is in addition to the moderate-intensity exercise.  Spend less time sitting. Even light physical activity can be  beneficial. Watch cholesterol and blood lipids Have your blood tested for lipids and cholesterol at 33 years of age, then have this test every 5 years. Have your cholesterol levels checked more often if:  Your lipid or cholesterol levels are high.  You are older than 33 years of age.  You are at high risk for heart disease. What should I know about cancer screening? Depending on your health history and family history, you may need to have cancer screening at various ages. This may include screening for:  Breast cancer.  Cervical cancer.  Colorectal cancer.  Skin cancer.  Lung cancer. What should I know about heart disease, diabetes, and high blood pressure? Blood pressure and heart disease  High blood pressure causes heart disease and increases the risk of stroke. This is more likely to develop in people who have high blood pressure readings, are of African descent, or are overweight.  Have your blood pressure checked: ? Every 3-5 years if you are 37-53 years of age. ? Every year if you are 45 years old or older. Diabetes Have regular diabetes screenings. This checks your fasting blood sugar level. Have the screening done:  Once every three years after age 77 if you are at a normal weight and have a low risk for diabetes.  More often and at a younger age if you are overweight or have a high risk for diabetes. What should I know about preventing infection? Hepatitis B If you have a higher risk  for hepatitis B, you should be screened for this virus. Talk with your health care provider to find out if you are at risk for hepatitis B infection. Hepatitis C Testing is recommended for:  Everyone born from [birth year] through 1963.  Anyone with known risk factors for hepatitis C. Sexually transmitted infections (STIs)  Get screened for STIs, including gonorrhea and chlamydia, if: ? You are sexually active and are younger than 33 years of age. ? You are older than 33 years of age and  your health care provider tells you that you are at risk for this type of infection. ? Your sexual activity has changed since you were last screened, and you are at increased risk for chlamydia or gonorrhea. Ask your health care provider if you are at risk.  Ask your health care provider about whether you are at high risk for HIV. Your health care provider may recommend a prescription medicine to help prevent HIV infection. If you choose to take medicine to prevent HIV, you should first get tested for HIV. You should then be tested every 3 months for as long as you are taking the medicine. Pregnancy  If you are about to stop having your period (premenopausal) and you may become pregnant, seek counseling before you get pregnant.  Take 400 to 800 micrograms (mcg) of folic acid every day if you become pregnant.  Ask for birth control (contraception) if you want to prevent pregnancy. Osteoporosis and menopause Osteoporosis is a disease in which the bones lose minerals and strength with aging. This can result in bone fractures. If you are 16 years old or older, or if you are at risk for osteoporosis and fractures, ask your health care provider if you should:  Be screened for bone loss.  Take a calcium or vitamin D supplement to lower your risk of fractures.  Be given hormone replacement therapy (HRT) to treat symptoms of menopause. Follow these instructions at home: Lifestyle  Do not use any products that contain nicotine or tobacco, such as cigarettes, e-cigarettes, and chewing tobacco. If you need help quitting, ask your health care provider.  Do not use street drugs.  Do not share needles.  Ask your health care provider for help if you need support or information about quitting drugs. Alcohol use  Do not drink alcohol if: ? Your health care provider tells you not to drink. ? You are pregnant, may be pregnant, or are planning to become pregnant.  If you drink alcohol: ? Limit how  much you use to 0-1 drink a day. ? Limit intake if you are breastfeeding.  Be aware of how much alcohol is in your drink. In the [place] one drink equals one 12 oz bottle of beer (355 mL), one 5 oz glass of wine (148 mL), or one 1 oz glass of hard liquor (44 mL). General instructions  Schedule regular health, dental, and eye exams.  Stay current with your vaccines.  Tell your health care provider if: ? You often feel depressed. ? You have ever been abused or do not feel safe at home. Summary  Adopting a healthy lifestyle and getting preventive care are important in promoting health and wellness.  Follow your health care provider's instructions about healthy diet, exercising, and getting tested or screened for diseases.  Follow your health care provider's instructions on monitoring your cholesterol and blood pressure. This information is not intended to replace advice given to you by your health care provider. Make sure you discuss any  questions you have with your health care provider. Document Revised: 10/13/2018 Document Reviewed: 10/13/2018 Elsevier Patient Education  2021 [employer].

## 2020-12-26 LAB — HEPATITIS C ANTIBODY
Hepatitis C Ab: NONREACTIVE
SIGNAL TO CUT-OFF: 0.02 (ref <1.00)

## 2020-12-26 LAB — HIV ANTIBODY (ROUTINE TESTING W REFLEX): HIV 1&2 Ab, 4th Generation: NONREACTIVE

## 2020-12-27 LAB — CYTOLOGY - PAP
Comment: NEGATIVE
Diagnosis: NEGATIVE
High risk HPV: POSITIVE — AB

## 2020-12-28 ENCOUNTER — Other Ambulatory Visit: Payer: Self-pay | Admitting: Physician Assistant

## 2020-12-28 ENCOUNTER — Telehealth: Payer: Self-pay

## 2020-12-28 ENCOUNTER — Other Ambulatory Visit: Payer: Self-pay | Admitting: *Deleted

## 2020-12-28 DIAGNOSIS — R87618 Other abnormal cytological findings on specimens from cervix uteri: Secondary | ICD-10-CM

## 2020-12-28 DIAGNOSIS — E282 Polycystic ovarian syndrome: Secondary | ICD-10-CM

## 2020-12-28 DIAGNOSIS — E559 Vitamin D deficiency, unspecified: Secondary | ICD-10-CM

## 2020-12-28 MED ORDER — METFORMIN HCL 500 MG PO TABS: 500.0000 mg | ORAL_TABLET | Freq: Every day | ORAL | 1 refills | Status: DC

## 2020-12-28 NOTE — Telephone Encounter (Signed)
Pt is returning call for Summer Arnold

## 2020-12-28 NOTE — Telephone Encounter (Signed)
See result notes. 

## 2020-12-28 NOTE — Telephone Encounter (Signed)
Pt needs referral to [facility], per [facility].

## 2020-12-28 NOTE — Telephone Encounter (Signed)
Order for referral placed.

## 2021-01-01 ENCOUNTER — Telehealth: Payer: Self-pay

## 2021-01-01 NOTE — Telephone Encounter (Signed)
Re-faxed referral to [facility]

## 2021-01-01 NOTE — Telephone Encounter (Signed)
Pt states [facility] did not receive her referral. Pt is asking for [place] to refax it

## 2021-01-04 ENCOUNTER — Encounter: Payer: Self-pay | Admitting: Physician Assistant

## 2021-01-04 LAB — FOLATE: Folate: 6.3

## 2021-01-17 DIAGNOSIS — F3131 Bipolar disorder, current episode depressed, mild: Secondary | ICD-10-CM | POA: Diagnosis not present

## 2021-01-17 DIAGNOSIS — F3111 Bipolar disorder, current episode manic without psychotic features, mild: Secondary | ICD-10-CM | POA: Diagnosis not present

## 2021-01-22 ENCOUNTER — Other Ambulatory Visit: Payer: Self-pay | Admitting: Physician Assistant

## 2021-01-23 ENCOUNTER — Other Ambulatory Visit: Payer: BC Managed Care – PPO

## 2021-01-31 DIAGNOSIS — Z3202 Encounter for pregnancy test, result negative: Secondary | ICD-10-CM | POA: Diagnosis not present

## 2021-01-31 DIAGNOSIS — Z3046 Encounter for surveillance of implantable subdermal contraceptive: Secondary | ICD-10-CM | POA: Diagnosis not present

## 2021-01-31 DIAGNOSIS — B977 Papillomavirus as the cause of diseases classified elsewhere: Secondary | ICD-10-CM | POA: Diagnosis not present

## 2021-01-31 DIAGNOSIS — E282 Polycystic ovarian syndrome: Secondary | ICD-10-CM | POA: Diagnosis not present

## 2021-02-22 ENCOUNTER — Ambulatory Visit: Payer: Self-pay

## 2021-02-28 DIAGNOSIS — F3176 Bipolar disorder, in full remission, most recent episode depressed: Secondary | ICD-10-CM | POA: Diagnosis not present

## 2021-02-28 DIAGNOSIS — F3174 Bipolar disorder, in full remission, most recent episode manic: Secondary | ICD-10-CM | POA: Diagnosis not present

## 2021-03-21 ENCOUNTER — Encounter: Payer: Self-pay | Admitting: Family Medicine

## 2021-03-21 ENCOUNTER — Telehealth (INDEPENDENT_AMBULATORY_CARE_PROVIDER_SITE_OTHER): Payer: BC Managed Care – PPO | Admitting: Family Medicine

## 2021-03-21 VITALS — Ht 61.0 in | Wt 274.0 lb

## 2021-03-21 DIAGNOSIS — U071 COVID-19: Secondary | ICD-10-CM

## 2021-03-21 MED ORDER — PREDNISONE 20 MG PO TABS: ORAL_TABLET | ORAL | 0 refills | Status: DC

## 2021-03-21 MED ORDER — BUDESONIDE 1 MG/2ML IN SUSP: 1.0000 mg | Freq: Every day | RESPIRATORY_TRACT | 0 refills | Status: AC

## 2021-03-21 NOTE — Patient Instructions (Addendum)
-  continue with your singulair -start flonase once to twice daily -robitussin DM for cough (has mucinex in it as well) -cool mist humidifier  -nebulized pulmicort prn cough/shortness of breath  -prednisone if needed.   -vitamin D3: continue this.  -vitamin C: 2000mg /day -zinc: 100mg /day -quercetin 500mg  twice a day -melatonin 10mg /night   I do these for 5-15 days.   Get pulse ox off [employer]. Want your pulse >94%.  Outside deep breathing -mouthwash three times a day   If oxygen starts to go below 92%, ER.   Tylenol for fever/headache.

## 2021-03-21 NOTE — Progress Notes (Signed)
Patient: Summer Arnold MRN: [id number] DOB: 03/27/1986 PCP: Jarold Motto, PA     I connected with Achille Rich on 03/21/21 at 1:51pm by a video enabled telemedicine application and verified that I am speaking with the correct person using two identifiers.  Location patient: Home Location provider: [facility], Office Persons participating in this virtual visit: Tenicia Gural and Dr. Artis Flock   I discussed the limitations of evaluation and management by telemedicine and the availability of in person appointments. The patient expressed understanding and agreed to proceed.   Subjective:  Chief Complaint  Patient presents with  . Covid Positive    Symptoms started Tuesday. Daughter was positive on Saturday.   . Cough  . Nasal Congestion    HPI: The patient is a 35 y.o. female who presents today for positive Covid result, cough and congestion. Her daughter started with symptoms on 5/13 and tested positive on 5/14. She started to have a headache on 5/16 and then on Tuesday she started to feel bad and started to get congested and coughing. It has progressively gotten worse. She is very congested in her nose. She has taken ibuprofen for her headache, but no fever/chills that she is aware of. She has no shortness of breath or wheezing. She does have a hx of asthma and just wants to make sure she is okay and doesn't progress. She is mildly achy but near her baseline. She is fatigued and wants to sleep. She also has sinus pain and pressure/congestion. By herself with her daughter so doesn't have much help for a break. Eating and drinking fine.   She has had 3 covid vaccines.   Review of Systems  Constitutional: Positive for fatigue. Negative for fever.  HENT: Positive for congestion, postnasal drip, sinus pressure and sinus pain.   Respiratory: Positive for cough. Negative for chest tightness and shortness of breath.   Gastrointestinal: Negative for abdominal pain, diarrhea, nausea and vomiting.   Neurological: Positive for headaches. Negative for dizziness.    Allergies Patient is allergic to aspirin and penicillins.  Past Medical History Patient  has a past medical history of Asthma, Bipolar 1 disorder (HCC), Nasal fracture (2014), PCOS (polycystic ovarian syndrome), Seasonal allergic reaction, and Vaginal delivery (2015).  Surgical History Patient  has a past surgical history that includes Wisdom tooth extraction.  Family History Pateint's family history includes COPD in her maternal grandfather; Cancer (age of onset: 69) in her paternal grandmother; Depression in her sister; Heart attack in her maternal grandfather; Hyperlipidemia in her maternal grandfather; Hypertension in her maternal grandfather; Mental illness in her father, mother, and sister; Osteoarthritis in her paternal grandmother.  Social History Patient  reports that she has quit smoking. She quit after 4.00 years of use. She has never used smokeless tobacco. She reports that she does not drink alcohol and does not use drugs.    Objective: Vitals:   03/21/21 1318  Weight: 274 lb 0.5 oz (124.3 kg)  Height: 5\' 1"  (1.549 m)    Body mass index is 51.78 kg/m.  Physical Exam Vitals reviewed.  Constitutional:      Appearance: Normal appearance. She is obese.  Pulmonary:     Effort: Pulmonary effort is normal.     Comments: Very comfortable, walking around and talking in full complete sentences. No work of breathing.  Neurological:     General: No focal deficit present.     Mental Status: She is alert and oriented to person, place, and time.  Psychiatric:  Mood and Affect: Mood normal.        Behavior: Behavior normal.        Assessment/plan: 1. COVID-19 -day 4 of illness and mild. Discussed anti viral drugs and declines at this point.  -starting her on treatment nutraceutical bundle including zinc sulfate, vit D, vit C, quercetin and melatonin 5-15+ days depending on symptoms. -allergy to  asa -start flonase daily/bid.  -mouthwash TID -pulmicort nebulizer Bid as needed -anti tussive with robitussin DM, honey, cool mist humidifier -sending in steroids to start only if feels tight, but at this point she will hold off. No asthma exacerbation and no breathing issues, but will have on hand if needs.  -outside as much as possible.  -pulse ox >93-94% -discussed prone sleeping as well.  -tylenol for headache/fever.  -strict ER precautions given.    Return if symptoms worsen or fail to improve.    Orland Mustard, MD [facility]  03/21/2021

## 2021-04-18 ENCOUNTER — Other Ambulatory Visit: Payer: Self-pay | Admitting: Physician Assistant

## 2021-05-22 DIAGNOSIS — F3176 Bipolar disorder, in full remission, most recent episode depressed: Secondary | ICD-10-CM | POA: Diagnosis not present

## 2021-05-22 DIAGNOSIS — F3174 Bipolar disorder, in full remission, most recent episode manic: Secondary | ICD-10-CM | POA: Diagnosis not present

## 2021-06-24 ENCOUNTER — Ambulatory Visit: Payer: BC Managed Care – PPO

## 2021-08-13 ENCOUNTER — Encounter: Payer: Self-pay | Admitting: Physician Assistant

## 2021-08-13 ENCOUNTER — Ambulatory Visit: Payer: BC Managed Care – PPO | Admitting: Physician Assistant

## 2021-08-26 DIAGNOSIS — Z23 Encounter for immunization: Secondary | ICD-10-CM | POA: Diagnosis not present

## 2022-01-10 DIAGNOSIS — R051 Acute cough: Secondary | ICD-10-CM | POA: Diagnosis not present

## 2022-01-10 DIAGNOSIS — J020 Streptococcal pharyngitis: Secondary | ICD-10-CM | POA: Diagnosis not present

## 2022-01-10 DIAGNOSIS — Z20822 Contact with and (suspected) exposure to covid-19: Secondary | ICD-10-CM | POA: Diagnosis not present

## 2022-07-10 ENCOUNTER — Ambulatory Visit (HOSPITAL_BASED_OUTPATIENT_CLINIC_OR_DEPARTMENT_OTHER)
Admission: RE | Admit: 2022-07-10 | Discharge: 2022-07-10 | Disposition: A | Discharge status: Home / Self Care | Payer: BC Managed Care – PPO | Source: Ambulatory Visit | Attending: Urgent Care | Admitting: Urgent Care

## 2022-07-10 ENCOUNTER — Ambulatory Visit
Admission: RE | Admit: 2022-07-10 | Discharge: 2022-07-10 | Disposition: A | Discharge status: Home / Self Care | Payer: BC Managed Care – PPO | Source: Ambulatory Visit | Attending: Urgent Care | Admitting: Urgent Care

## 2022-07-10 VITALS — BP 129/85 | HR 96 | Temp 97.9°F | Resp 16

## 2022-07-10 DIAGNOSIS — M7731 Calcaneal spur, right foot: Secondary | ICD-10-CM | POA: Insufficient documentation

## 2022-07-10 DIAGNOSIS — M19071 Primary osteoarthritis, right ankle and foot: Secondary | ICD-10-CM | POA: Insufficient documentation

## 2022-07-10 DIAGNOSIS — M79671 Pain in right foot: Secondary | ICD-10-CM

## 2022-07-10 MED ORDER — NAPROXEN 500 MG PO TABS
500.0000 mg | ORAL_TABLET | Freq: Two times a day (BID) | ORAL | 0 refills | Status: DC
Start: 2022-07-10 — End: 2023-07-02

## 2022-07-10 NOTE — ED Triage Notes (Signed)
Pt. States that she is having right foot pain. It started a few days ago randomly. Pt. Has hx plantar fasciitis and heel spurs. Pt. Has been treating pain w. OTC medications.

## 2022-07-10 NOTE — ED Provider Notes (Signed)
[facility]  Note:  This document was prepared using Conservation officer, historic buildings and may include unintentional dictation errors.  MRN: [id number] DOB: 09/05/1986  Subjective:   Summer Arnold is a 36 y.o. female presenting for 3 to 4-day history of acute onset right foot pain over the plantar medial surface of her foot.  Patient has a history of plantar fasciitis and bone spurs but states this hurts her very differently.  Feels like there is slight bruising that she can see over the top of her foot.  No trauma, swelling, wounds.  No history of gout.  Patient is able to tolerate NSAIDs but just cannot take aspirin.  No current facility-administered medications for this encounter.  Current Outpatient Medications:    albuterol (VENTOLIN HFA) 108 (90 Base) MCG/ACT inhaler, Inhale 2 puffs into the lungs every 4 (four) hours as needed for wheezing or shortness of breath., Disp: 18 g, Rfl: 2   budesonide (PULMICORT) 1 MG/2ML nebulizer solution, Take 2 mLs (1 mg total) by nebulization daily., Disp: 60 mL, Rfl: 0   Cetirizine HCl 10 MG CAPS, Take by mouth., Disp: , Rfl:    etonogestrel (NEXPLANON) 68 MG IMPL implant, Inject into the skin. Inserted in 11/2017, Disp: , Rfl:    ibuprofen (ADVIL) 200 MG tablet, Take 800 mg by mouth every 6 (six) hours as needed. (Patient not taking: Reported on 03/21/2021), Disp: , Rfl:    ibuprofen (ADVIL) 800 MG tablet, Take 800 mg by mouth 3 (three) times daily as needed., Disp: , Rfl:    lamoTRIgine (LAMICTAL) 25 MG tablet, Take 50 mg by mouth daily., Disp: , Rfl:    lithium carbonate (ESKALITH) 450 MG CR tablet, Take 450 mg by mouth at bedtime., Disp: , Rfl:    metFORMIN (GLUCOPHAGE) 500 MG tablet, Take 1 tablet (500 mg total) by mouth daily with breakfast., Disp: 90 tablet, Rfl: 1   montelukast (SINGULAIR) 10 MG tablet, TAKE 1 TABLET BY MOUTH EVERYDAY AT BEDTIME, Disp: 90 tablet, Rfl: 1   predniSONE (DELTASONE) 20 MG tablet, 60mg  PO x 3 days  then 40mg  PO x 3days then 20mg  x 3 day then 10mg  x 3 days, Disp: 18 tablet, Rfl: 0   spironolactone (ALDACTONE) 25 MG tablet, TAKE 1 TABLET (25 MG TOTAL) BY MOUTH DAILY., Disp: 30 tablet, Rfl: 1   Vitamin D, Ergocalciferol, (DRISDOL) 1.25 MG (50000 UNIT) CAPS capsule, Take 10,000 Units by mouth every 7 (seven) days., Disp: , Rfl:    Allergies  Allergen Reactions   Aspirin    Penicillins     Past Medical History:  Diagnosis Date   Asthma    Bipolar 1 disorder (HCC)    Nasal fracture 2014   No surgery   PCOS (polycystic ovarian syndrome)    Seasonal allergic reaction    Vaginal delivery 2015     Past Surgical History:  Procedure Laterality Date   WISDOM TOOTH EXTRACTION      Family History  Problem Relation Age of Onset   Mental illness Mother    Mental illness Father    Mental illness Sister    Depression Sister    COPD Maternal Grandfather    Hypertension Maternal Grandfather    Hyperlipidemia Maternal Grandfather    Heart attack Maternal Grandfather    Osteoarthritis Paternal Grandmother    Cancer Paternal Grandmother 70       Colon cancer    Social History   Tobacco Use   Smoking status: Former  Years: 4.00    Types: Cigarettes   Smokeless tobacco: Never   Tobacco comments:    Quit 2014  Vaping Use   Vaping Use: Never used  Substance Use Topics   Alcohol use: No   Drug use: No    ROS   Objective:   Vitals: BP 129/85   Pulse 96   Temp 97.9 F (36.6 C)   Resp 16   SpO2 95%   Physical Exam Constitutional:      General: She is not in acute distress.    Appearance: Normal appearance. She is well-developed. She is not ill-appearing, toxic-appearing or diaphoretic.  HENT:     Head: Normocephalic and atraumatic.     Nose: Nose normal.     Mouth/Throat:     Mouth: Mucous membranes are moist.  Eyes:     General: No scleral icterus.       Right eye: No discharge.        Left eye: No discharge.     Extraocular Movements: Extraocular movements  intact.  Cardiovascular:     Rate and Rhythm: Normal rate.  Pulmonary:     Effort: Pulmonary effort is normal.  Musculoskeletal:     Right foot: Decreased range of motion. Normal capillary refill. Swelling, tenderness (over area outlined) and bony tenderness present. No deformity, laceration or crepitus.       Feet:  Skin:    General: Skin is warm and dry.  Neurological:     General: No focal deficit present.     Mental Status: She is alert and oriented to person, place, and time.  Psychiatric:        Mood and Affect: Mood normal.        Behavior: Behavior normal.     Assessment and Plan :   PDMP not reviewed this encounter.  1. Right foot pain    Patient placed into a postop shoe.  We will pursue an outpatient x-ray as our radiology tech was pulled to another side.  Recommended naproxen for pain and inflammation. Counseled patient on potential for adverse effects with medications prescribed/recommended today, ER and return-to-clinic precautions discussed, patient verbalized understanding.   I will update diagnosis and treatment plan as necessary based off of her x-ray results.   Wallis Bamberg, PA-C 07/10/22 1029

## 2022-07-28 ENCOUNTER — Encounter: Payer: Self-pay | Admitting: *Deleted

## 2022-09-01 ENCOUNTER — Encounter: Payer: BC Managed Care – PPO | Admitting: Physician Assistant

## 2022-09-05 DIAGNOSIS — Z720 Tobacco use: Secondary | ICD-10-CM | POA: Diagnosis not present

## 2022-09-12 DIAGNOSIS — Z23 Encounter for immunization: Secondary | ICD-10-CM | POA: Diagnosis not present

## 2022-10-16 ENCOUNTER — Encounter: Payer: Self-pay | Admitting: *Deleted

## 2022-11-05 ENCOUNTER — Telehealth: Payer: Self-pay | Admitting: *Deleted

## 2022-11-05 NOTE — Patient Outreach (Signed)
  Care Coordination   11/05/2022 Name: Summer Arnold MRN: [id number] DOB: [birth year]   Care Coordination Outreach Attempts:  An unsuccessful telephone outreach was attempted today to offer the patient information about available care coordination services as a benefit of their health plan.   Follow Up Plan:  Additional outreach attempts will be made to offer the patient care coordination information and services.   Encounter Outcome:  No Answer   Care Coordination Interventions:  No, not indicated    Raina Mina, RN Care Management Coordinator [place] Office [phone]

## 2023-01-23 NOTE — Congregational Nurse Program (Unsigned)
Blood glucose 107 non fasting, blood pressure rechecked 137/97 Pharmacist educated on metformin and side effects. Recommended to contact PCP.

## 2023-03-03 ENCOUNTER — Telehealth: Payer: Self-pay | Admitting: Physician Assistant

## 2023-03-03 DIAGNOSIS — B9689 Other specified bacterial agents as the cause of diseases classified elsewhere: Secondary | ICD-10-CM

## 2023-03-03 DIAGNOSIS — J208 Acute bronchitis due to other specified organisms: Secondary | ICD-10-CM

## 2023-03-04 MED ORDER — AZITHROMYCIN 250 MG PO TABS: ORAL_TABLET | ORAL | 0 refills | Status: AC

## 2023-03-04 MED ORDER — BENZONATATE 100 MG PO CAPS: 100.0000 mg | ORAL_CAPSULE | Freq: Three times a day (TID) | ORAL | 0 refills | Status: DC | PRN

## 2023-03-04 NOTE — Progress Notes (Signed)
I have spent 5 minutes in review of e-visit questionnaire, review and updating patient chart, medical decision making and response to patient.   Collier Monica Cody Kalup Jaquith, PA-C    

## 2023-03-04 NOTE — Progress Notes (Signed)

## 2023-06-30 NOTE — Progress Notes (Signed)
Summer Arnold is a 37 y.o. female here for a {New prob or follow up:31724}.  History of Present Illness:   No chief complaint on file.   HPI Weight Loss:  Reports that she is interested in weight loss medication.   -Diet:  -Exercise:  -***   Past Medical History:  Diagnosis Date   Asthma    Bipolar 1 disorder (HCC)    Nasal fracture 2014   No surgery   PCOS (polycystic ovarian syndrome)    Seasonal allergic reaction    Vaginal delivery 2015     Social History   Tobacco Use   Smoking status: Former    Types: Cigarettes   Smokeless tobacco: Never   Tobacco comments:    Quit 2014  Vaping Use   Vaping status: Never Used  Substance Use Topics   Alcohol use: No   Drug use: No    Past Surgical History:  Procedure Laterality Date   WISDOM TOOTH EXTRACTION      Family History  Problem Relation Age of Onset   Mental illness Mother    Mental illness Father    Mental illness Sister    Depression Sister    COPD Maternal Grandfather    Hypertension Maternal Grandfather    Hyperlipidemia Maternal Grandfather    Heart attack Maternal Grandfather    Osteoarthritis Paternal Grandmother    Cancer Paternal Grandmother 21       Colon cancer    Allergies  Allergen Reactions   Aspirin    Penicillins     Current Medications:   Current Outpatient Medications:    albuterol (VENTOLIN HFA) 108 (90 Base) MCG/ACT inhaler, Inhale 2 puffs into the lungs every 4 (four) hours as needed for wheezing or shortness of breath., Disp: 18 g, Rfl: 2   benzonatate (TESSALON) 100 MG capsule, Take 1 capsule (100 mg total) by mouth 3 (three) times daily as needed for cough., Disp: 30 capsule, Rfl: 0   budesonide (PULMICORT) 1 MG/2ML nebulizer solution, Take 2 mLs (1 mg total) by nebulization daily., Disp: 60 mL, Rfl: 0   Cetirizine HCl 10 MG CAPS, Take by mouth., Disp: , Rfl:    etonogestrel (NEXPLANON) 68 MG IMPL implant, Inject into the skin. Inserted in 11/2017, Disp: , Rfl:     ibuprofen (ADVIL) 200 MG tablet, Take 800 mg by mouth every 6 (six) hours as needed. (Patient not taking: Reported on 03/21/2021), Disp: , Rfl:    ibuprofen (ADVIL) 800 MG tablet, Take 800 mg by mouth 3 (three) times daily as needed., Disp: , Rfl:    lamoTRIgine (LAMICTAL) 25 MG tablet, Take 50 mg by mouth daily., Disp: , Rfl:    lithium carbonate (ESKALITH) 450 MG CR tablet, Take 450 mg by mouth at bedtime., Disp: , Rfl:    metFORMIN (GLUCOPHAGE) 500 MG tablet, Take 1 tablet (500 mg total) by mouth daily with breakfast., Disp: 90 tablet, Rfl: 1   montelukast (SINGULAIR) 10 MG tablet, TAKE 1 TABLET BY MOUTH EVERYDAY AT BEDTIME, Disp: 90 tablet, Rfl: 1   naproxen (NAPROSYN) 500 MG tablet, Take 1 tablet (500 mg total) by mouth 2 (two) times daily with a meal., Disp: 30 tablet, Rfl: 0   spironolactone (ALDACTONE) 25 MG tablet, TAKE 1 TABLET (25 MG TOTAL) BY MOUTH DAILY., Disp: 30 tablet, Rfl: 1   Vitamin D, Ergocalciferol, (DRISDOL) 1.25 MG (50000 UNIT) CAPS capsule, Take 10,000 Units by mouth every 7 (seven) days., Disp: , Rfl:    Review of Systems:  ROS  Vitals:   There were no vitals filed for this visit.   There is no height or weight on file to calculate BMI.  Physical Exam:   Physical Exam  Assessment and Plan:   There are no diagnoses linked to this encounter.  I,Emily Lagle,acting as a Neurosurgeon for [employer], PA.,have documented all relevant documentation on the behalf of Jarold Motto, PA,as directed by  Jarold Motto, PA while in the presence of Jarold Motto, [place].  I, Larey Brick, have reviewed all documentation for this visit. The documentation on 06/30/23 for the exam, diagnosis, procedures, and orders are all accurate and complete.***   Jarold Motto, PA-C

## 2023-07-02 ENCOUNTER — Encounter: Payer: Self-pay | Admitting: Physician Assistant

## 2023-07-02 ENCOUNTER — Ambulatory Visit: Payer: No Typology Code available for payment source | Admitting: Physician Assistant

## 2023-07-02 VITALS — BP 138/90 | HR 71 | Temp 97.5°F | Ht 61.0 in | Wt 281.0 lb

## 2023-07-02 DIAGNOSIS — R030 Elevated blood-pressure reading, without diagnosis of hypertension: Secondary | ICD-10-CM | POA: Diagnosis not present

## 2023-07-02 DIAGNOSIS — E282 Polycystic ovarian syndrome: Secondary | ICD-10-CM

## 2023-07-02 DIAGNOSIS — Z6841 Body Mass Index (BMI) 40.0 and over, adult: Secondary | ICD-10-CM

## 2023-07-02 DIAGNOSIS — E669 Obesity, unspecified: Secondary | ICD-10-CM

## 2023-07-02 DIAGNOSIS — E785 Hyperlipidemia, unspecified: Secondary | ICD-10-CM | POA: Insufficient documentation

## 2023-07-02 LAB — CBC
HCT: 46.6 % — ABNORMAL HIGH (ref 36.0–46.0)
Hemoglobin: 15.3 g/dL — ABNORMAL HIGH (ref 12.0–15.0)
MCHC: 32.8 g/dL (ref 30.0–36.0)
MCV: 87.1 fl (ref 78.0–100.0)
Platelets: 234 10*3/uL (ref 150.0–400.0)
RBC: 5.35 Mil/uL — ABNORMAL HIGH (ref 3.87–5.11)
RDW: 12.9 % (ref 11.5–15.5)
WBC: 7.4 10*3/uL (ref 4.0–10.5)

## 2023-07-02 LAB — COMPREHENSIVE METABOLIC PANEL
ALT: 27 U/L (ref 0–35)
AST: 19 U/L (ref 0–37)
Albumin: 4.2 g/dL (ref 3.5–5.2)
Alkaline Phosphatase: 70 U/L (ref 39–117)
BUN: 14 mg/dL (ref 6–23)
CO2: 27 mEq/L (ref 19–32)
Calcium: 9.3 mg/dL (ref 8.4–10.5)
Chloride: 105 meq/L (ref 96–112)
Creatinine, Ser: 0.67 mg/dL (ref 0.40–1.20)
GFR: 113.12 mL/min (ref >60.00)
Glucose, Bld: 90 mg/dL (ref 70–99)
Potassium: 4 meq/L (ref 3.5–5.1)
Sodium: 138 meq/L (ref 135–145)
Total Bilirubin: 0.5 mg/dL (ref 0.2–1.2)
Total Protein: 7.1 g/dL (ref 6.0–8.3)

## 2023-07-02 LAB — LIPID PANEL
Cholesterol: 176 mg/dL (ref 0–200)
HDL: 28 mg/dL — ABNORMAL LOW (ref >39.00)
LDL Cholesterol: 121 mg/dL — ABNORMAL HIGH (ref 0–99)
NonHDL: 147.58
Total CHOL/HDL Ratio: 6
Triglycerides: 135 mg/dL (ref 0.0–149.0)
VLDL: 27 mg/dL (ref 0.0–40.0)

## 2023-07-02 LAB — HEMOGLOBIN A1C: Hgb A1c MFr Bld: 5.5 % (ref 4.6–6.5)

## 2023-07-02 MED ORDER — SEMAGLUTIDE-WEIGHT MANAGEMENT 0.25 MG/0.5ML ~~LOC~~ SOAJ
0.2500 mg | SUBCUTANEOUS | 1 refills | Status: AC
Start: 2023-07-02 — End: ?

## 2023-07-02 NOTE — Patient Instructions (Signed)
It was great to see you!  We will send in Wegovy 0.25 mg weekly for you to start -- this will require prior authorization -- please be patient with this process  If blood pressure is still elevated, let's try to have you check this at home between now and your Comprehensive Physical Exam (CPE) preventive care annual visit   Let's follow-up in 2 months for your annual exam and to check in on blood pressure and weight, sooner if you have concerns.  Take care,  Jarold Motto PA-C

## 2023-07-03 LAB — TSH: TSH: 0.68 u[IU]/mL (ref 0.35–5.50)

## 2023-07-09 ENCOUNTER — Telehealth: Payer: Self-pay

## 2023-07-09 NOTE — Telephone Encounter (Signed)
 Clinical questions answered. PA submitted

## 2023-07-09 NOTE — Telephone Encounter (Signed)
Pharmacy Patient Advocate Encounter   Received notification from CoverMyMeds that prior authorization for [facility] 0.25MG /0.5ML auto-injectors is required/requested.   Insurance verification completed.   The patient is insured through [facility] .   Per test claim: PA required; PA started via CoverMyMeds. KEY [id number] . Waiting for clinical questions to populate.

## 2023-07-13 ENCOUNTER — Telehealth: Payer: Self-pay | Admitting: Physician Assistant

## 2023-07-13 NOTE — Telephone Encounter (Signed)
Pharmacy Patient Advocate Encounter  Received notification from [facility] that Prior Authorization for [facility] 0.25MG /0.5ML auto-injectors has been DENIED.  Full denial letter will be uploaded to the media tab. See denial reason below.   PA #/Case ID/Reference #: [id number]   Denial Reason: Drug Not Covered/Plan Exclusion - Your request for coverage was denied because your prescription benefit plan does not cover the requested medication.

## 2023-07-13 NOTE — Telephone Encounter (Signed)
Please see pt call and advise next steps in care plan since [facility] was denied. Denial Reason: Drug Not Covered/Plan Exclusion - Your request for coverage was denied because your prescription benefit plan does not cover the requested medication.

## 2023-07-13 NOTE — Telephone Encounter (Signed)
Pt would like a call back concerning her PA denial of Wegovy. She would like to talk about other options that will be approved through her insurance. Please advise.

## 2023-07-14 NOTE — Telephone Encounter (Signed)
Left message on voicemail to call office.  

## 2023-07-14 NOTE — Telephone Encounter (Signed)
Spoke to pt told her PA for Reg[facility] Lutes was denied by insurance to due plan exclusion, does not cover weight loss medications. Pt verbalized understanding and said she got a letter and said about Mounjaro and Ozempic. Told pt those are strictly for diabetics can not prescribe them. Pt verbalized understanding and said what am I suppose to do I need help. Told pt I will send message to Lelon Mast to see what else she has to offer and get back to you. Pt verbalized understanding.

## 2023-07-14 NOTE — Telephone Encounter (Signed)
Pt called back told her Lelon Mast said,  Unfortunately due to her elevated blood pressure, I do not have any safe alternatives to offer at this time. But can discuss at upcoming visit. Pt verbalized understanding and said even if I can show you blood Pressure has been normal. Told pt your blood pressure has been Elevated the last few times here in the office. Told her to keep track  And write it down and bring at your visit. Pt verbalized understanding and York Spaniel she has filed for an appeal with her insurance for [employer] and they will Be sending information. Told her okay I will keep an eye out For it.

## 2023-07-14 NOTE — Telephone Encounter (Signed)
Summer Arnold, pt wants to know what else she can do for weight loss. Please advise.

## 2023-07-27 NOTE — Telephone Encounter (Signed)
Unable to appeal medication due to not a covered benefit, excluded.

## 2023-08-17 NOTE — Telephone Encounter (Signed)
Patient called with Summer Arnold member services representative, Summer Arnold on the line.    Per Summer Arnold, PCP office would need to send an appeal with medical records and medical necessity letter attached. States that in order to have appeal expedited so that it only takes 24-72 hours for determination is to call [phone]. States that at this number a verbal appeal can be completed and the documents don't need to be received by them at that time if this option is done. Summer further informed me that documents needed can be sent through [employer] to guarantee that the documents are received in time.

## 2023-08-20 NOTE — Telephone Encounter (Signed)
Called [employer] at [phone] spoke to Loralyn Freshwater, told her calling about an appeal. Told her pt spoke to someone who told her she could do an appeal,but it is a plan exclusion so it is not covered. Loralyn Freshwater told me pt's insurance is expired, currently not covered. Told her okay, I will check with pt to see if we have updated card.

## 2023-08-21 NOTE — Telephone Encounter (Signed)
Left message on voicemail to call office.  

## 2023-08-24 ENCOUNTER — Telehealth: Payer: Self-pay | Admitting: Physician Assistant

## 2023-08-24 NOTE — Telephone Encounter (Signed)
Patient returned call. Requests to be called. In a meeting between 9:30 -10:30 am then 2:30 - 3:30 pm

## 2023-08-25 NOTE — Progress Notes (Signed)
Subjective:    Summer Arnold is a 37 y.o. female and is here for a comprehensive physical exam.  HPI  There are no preventive care reminders to display for this patient.   Acute Concerns: None  Chronic Issues: Allergies and Asthma Treated with albuterol inhaler, budesonide nebulizer. Has some issues with eyes that she believes is allergy related. Reports asthma symptoms are stable. Requesting albuterol inhaler refill.  Bipolar Disorder She has been on lamictal and lithium in the past. She did not like the way these made her feel. Seen by Dr. Evelene Croon as recently as 1-2 years ago. She is agreeable to doing talk therapy.  PCOS Nexplanon IUD in place. Inserted 2019 and recently replaced. Seen by [facility].  Obesity She would like to try semaglutide but she is having issues with getting this covered by her insurance. She has not tried metformin XR but the instant release form caused diarrhea. Not interested in taking phentermine, bupropion due to concerns regarding interaction with bipolar disorder, mental health. She is working on [employer] and exercise.  Requesting Vitamin D test.  Health Maintenance: Immunizations -- UTD on tetanus vaccine. Colonoscopy -- N/A Mammogram -- N/A PAP -- NILM in 12/25/20. Bone Density -- N/A Diet -- Healthy Exercise -- She is on her feet frequently at her job.  Sleep habits -- Stable Mood -- Stable  UTD with dentist? - No UTD with eye doctor? - No  Weight history: Wt Readings from Last 10 Encounters:  08/26/23 278 lb 6.1 oz (126.3 kg)  07/02/23 281 lb (127.5 kg)  03/21/21 274 lb 0.5 oz (124.3 kg)  12/25/20 274 lb (124.3 kg)  11/08/19 229 lb (103.9 kg)  11/07/19 231 lb (104.8 kg)  09/09/19 222 lb (100.7 kg)  11/12/13 185 lb (83.9 kg)   Body mass index is 52.6 kg/m. No LMP recorded. Patient has had an implant.  Alcohol use:  reports no history of alcohol use.  Tobacco use:  Tobacco Use: Medium Risk (08/26/2023)    Patient History    Smoking Tobacco Use: Former    Smokeless Tobacco Use: Never    Passive Exposure: Not on file   Eligible for lung cancer screening? no     07/02/2023    9:34 AM  Depression screen PHQ 2/9  Decreased Interest 0  Down, Depressed, Hopeless 0  PHQ - 2 Score 0     Other providers/specialists: Patient Care Team: Jarold Motto, [place] as PCP - General (Physician Assistant) Milagros Evener, MD as Consulting Physician (Psychiatry)    PMHx, SurgHx, SocialHx, Medications, and Allergies were reviewed in the Visit Navigator and updated as appropriate.   Past Medical History:  Diagnosis Date   Asthma    Bipolar 1 disorder (HCC)    Nasal fracture 2014   No surgery   PCOS (polycystic ovarian syndrome)    Seasonal allergic reaction    Vaginal delivery 2015     Past Surgical History:  Procedure Laterality Date   WISDOM TOOTH EXTRACTION       Family History  Problem Relation Age of Onset   Mental illness Mother    Hypertension Mother    Mental illness Father    Mental illness Sister    Depression Sister    Mental illness Maternal Grandmother    COPD Maternal Grandfather    Hypertension Maternal Grandfather    Hyperlipidemia Maternal Grandfather    Heart attack Maternal Grandfather    AAA (abdominal aortic aneurysm) Maternal Grandfather    Osteoarthritis Paternal  Grandmother    Cancer Paternal Grandmother 58       Colon cancer   Uterine cancer Maternal Aunt    Polycystic ovary syndrome Maternal Aunt    Deep vein thrombosis Maternal Aunt    Diabetes Paternal Aunt    Thyroid cancer Neg Hx     Social History   Tobacco Use   Smoking status: Former    Types: Cigarettes   Smokeless tobacco: Never   Tobacco comments:    Quit 2014  Vaping Use   Vaping status: Never Used  Substance Use Topics   Alcohol use: No   Drug use: No    Review of Systems:   Review of Systems  Constitutional:  Negative for chills, fever, malaise/fatigue and weight loss.   HENT:  Negative for hearing loss, sinus pain and sore throat.   Respiratory:  Negative for cough, hemoptysis and shortness of breath.   Cardiovascular:  Negative for chest pain, palpitations, leg swelling and PND.  Gastrointestinal:  Negative for abdominal pain, constipation, diarrhea, heartburn, nausea and vomiting.  Genitourinary:  Negative for dysuria, frequency and urgency.  Musculoskeletal:  Negative for back pain, myalgias and neck pain.  Skin:  Negative for itching and rash.  Neurological:  Negative for dizziness, tingling, seizures and headaches.  Endo/Heme/Allergies:  Negative for polydipsia.  Psychiatric/Behavioral:  Negative for depression. The patient is not nervous/anxious.     Objective:   BP 126/80 (BP Location: Right Arm, Patient Position: Sitting, Cuff Size: Large)   Pulse 89   Temp 97.7 F (36.5 C) (Temporal)   Ht 5\' 1"  (1.549 m)   Wt 278 lb 6.1 oz (126.3 kg)   SpO2 96%   BMI 52.60 kg/m  Body mass index is 52.6 kg/m.   General Appearance:    Alert, cooperative, no distress, appears stated age  Head:    Normocephalic, without obvious abnormality, atraumatic  Eyes:    PERRL, conjunctiva/corneas clear, EOM's intact, fundi    benign, both eyes  Ears:    Normal TM's and external ear canals, both ears  Nose:   Nares normal, septum midline, mucosa normal, no drainage    or sinus tenderness  Throat:   Lips, mucosa, and tongue normal; teeth and gums normal  Neck:   Supple, symmetrical, trachea midline, no adenopathy;    thyroid:  no enlargement/tenderness/nodules; no carotid   bruit or JVD  Back:     Symmetric, no curvature, ROM normal, no CVA tenderness  Lungs:     Clear to auscultation bilaterally, respirations unlabored  Chest Wall:    No tenderness or deformity   Heart:    Regular rate and rhythm, S1 and S2 normal, no murmur, rub or gallop  Breast Exam:    Deferred  Abdomen:     Soft, non-tender, bowel sounds active all four quadrants,    no masses, no  organomegaly  Genitalia:    Deferred  Extremities:   Extremities normal, atraumatic, no cyanosis or edema  Pulses:   2+ and symmetric all extremities  Skin:   Skin color, texture, turgor normal, no rashes or lesions  Lymph nodes:   Cervical, supraclavicular, and axillary nodes normal  Neurologic:   CNII-XII intact, normal strength, sensation and reflexes    throughout    Assessment/Plan:   Routine physical examination Today patient counseled on age appropriate routine health concerns for screening and prevention, each reviewed and up to date or declined. Immunizations reviewed and up to date or declined. Labs ordered and reviewed. Risk  factors for depression reviewed and negative. Hearing function and visual acuity are intact. ADLs screened and addressed as needed. Functional ability and level of safety reviewed and appropriate. Education, counseling and referrals performed based on assessed risks today. Patient provided with a copy of personalized plan for preventive services.  PCOS (polycystic ovarian syndrome; )Obesity, unspecified class, unspecified obesity type, unspecified whether serious comorbidity present Will trial extended release metformin 500 mg daily Will trial another appeal for [facility] as staffing and time constraints allow Continue healthy lifestyle efforts with diet and exercise  Vitamin D deficiency Update vitamin D per patient request  Moderate persistent asthma without complication Refill albuterol  Bipolar 2 disorder (HCC) Declines medications however does have a relationship with Dr Evelene Croon and is likely still a patient with them, recommended she reconnect with them if ongoing needs Will place referral for talk therapy Denies current suicidal ideation/hi  I,Alexander Ruley,acting as a Neurosurgeon for [employer], PA.,have documented all relevant documentation on the behalf of Jarold Motto, PA,as directed by  Jarold Motto, PA while in the presence of Jarold Motto, [place].  I, Jarold Motto, [place], have reviewed all documentation for this visit. The documentation on 08/26/23 for the exam, diagnosis, procedures, and orders are all accurate and complete.  Jarold Motto, PA-C [facility]

## 2023-08-25 NOTE — Telephone Encounter (Signed)
Patient called requesting call back from PCP nurse when able.

## 2023-08-26 ENCOUNTER — Ambulatory Visit: Payer: No Typology Code available for payment source | Admitting: Physician Assistant

## 2023-08-26 ENCOUNTER — Encounter: Payer: Self-pay | Admitting: Physician Assistant

## 2023-08-26 VITALS — BP 126/80 | HR 89 | Temp 97.7°F | Ht 61.0 in | Wt 278.4 lb

## 2023-08-26 DIAGNOSIS — E282 Polycystic ovarian syndrome: Secondary | ICD-10-CM | POA: Diagnosis not present

## 2023-08-26 DIAGNOSIS — F3181 Bipolar II disorder: Secondary | ICD-10-CM

## 2023-08-26 DIAGNOSIS — Z0000 Encounter for general adult medical examination without abnormal findings: Secondary | ICD-10-CM | POA: Diagnosis not present

## 2023-08-26 DIAGNOSIS — J4540 Moderate persistent asthma, uncomplicated: Secondary | ICD-10-CM

## 2023-08-26 DIAGNOSIS — E669 Obesity, unspecified: Secondary | ICD-10-CM

## 2023-08-26 DIAGNOSIS — E559 Vitamin D deficiency, unspecified: Secondary | ICD-10-CM

## 2023-08-26 LAB — VITAMIN D 25 HYDROXY (VIT D DEFICIENCY, FRACTURES): VITD: 25.29 ng/mL — ABNORMAL LOW (ref 30.00–100.00)

## 2023-08-26 MED ORDER — ALBUTEROL SULFATE HFA 108 (90 BASE) MCG/ACT IN AERS: 2.0000 | INHALATION_SPRAY | RESPIRATORY_TRACT | 2 refills | Status: AC | PRN

## 2023-08-26 MED ORDER — METFORMIN HCL ER 500 MG PO TB24: 500.0000 mg | ORAL_TABLET | Freq: Every day | ORAL | 0 refills | Status: AC

## 2023-08-26 NOTE — Telephone Encounter (Signed)
Pt has an appt today, will discuss at visit.

## 2023-08-26 NOTE — Patient Instructions (Signed)
It was great to see you!  Go to the eye doctor!  Update your dental exam.  Consider going back to Dr Evelene Croon if your mental health worsens.  Let's place referral for talk therapy  We will try once more to call your insurance company.  Try extended release metformin.  Let's follow-up in 3 months once you have started metformin/wegovy, sooner if you have concerns.  Take care,  Jarold Motto PA-C

## 2023-08-26 NOTE — Telephone Encounter (Signed)
See other message

## 2023-08-26 NOTE — Telephone Encounter (Signed)
Discussed with patient that I called the number given and was told insurance was expired, and I told pt with plan exclusions an appeal can not be done. Pt said she has insurance that is not true and she has spoke to someone in appeals and was told needs letter of necessity and office notes sent over to appeals. Told pt I will call again and see what I can do. Pt verbalized understanding.

## 2023-08-26 NOTE — Telephone Encounter (Signed)
Called [employer] at [phone] and spoke to [place] told her I am calling to do an appeal for Medication for pt. Lily said I have to go to the [employer] and download appeal form. Told her okay.  Called [employer] back again spoke to Narka told her I am trying to find an appeal form for a medication that was denied and was told to go to website but I can not find correct form. Asked her if she could just fax to me. Misty Stanley said no you have to use the website she said she can walk me through to find correct form. Misty Stanley walked me thru and I download correct form.

## 2023-08-27 ENCOUNTER — Encounter: Payer: Self-pay | Admitting: *Deleted

## 2023-08-27 NOTE — Telephone Encounter (Signed)
Appeal form completed and faxed to [facility] at [phone] along with office notes and denial letter. Awaiting response.

## 2023-10-14 ENCOUNTER — Encounter: Payer: Self-pay | Admitting: *Deleted

## 2023-10-14 NOTE — Telephone Encounter (Signed)
Called [facility] spoke to Mar M, told him calling about an appeal that I sent over a month ago and still have not received word if approved or denied. Mar said on 09/04/2023 the appeal was denied for [facility]. Told him okay please send me a copy of the denial letter to put in patients chart. Mar verbalized understanding and said will send copy and here is the control # [id number]. Told him okay, thank you.

## 2023-11-26 ENCOUNTER — Ambulatory Visit: Payer: Managed Care, Other (non HMO) | Admitting: Physician Assistant

## 2024-03-10 ENCOUNTER — Encounter: Payer: Self-pay | Admitting: Physician Assistant

## 2024-06-30 ENCOUNTER — Other Ambulatory Visit: Payer: Self-pay | Admitting: Medical Genetics

## 2024-07-08 ENCOUNTER — Other Ambulatory Visit
Admission: RE | Admit: 2024-07-08 | Discharge: 2024-07-08 | Disposition: A | Discharge status: Home / Self Care | Payer: Self-pay | Source: Ambulatory Visit | Attending: Medical Genetics | Admitting: Medical Genetics

## 2024-07-18 LAB — GENECONNECT MOLECULAR SCREEN: Genetic Analysis Overall Interpretation: NEGATIVE

## 2024-10-13 ENCOUNTER — Encounter: Payer: Self-pay | Admitting: Family Medicine

## 2024-10-13 ENCOUNTER — Ambulatory Visit: Admitting: Family Medicine

## 2024-10-13 VITALS — BP 118/78 | HR 108 | Temp 98.8°F | Ht 61.0 in | Wt 289.0 lb

## 2024-10-13 DIAGNOSIS — J4520 Mild intermittent asthma, uncomplicated: Secondary | ICD-10-CM | POA: Diagnosis not present

## 2024-10-13 DIAGNOSIS — J069 Acute upper respiratory infection, unspecified: Secondary | ICD-10-CM

## 2024-10-13 DIAGNOSIS — J029 Acute pharyngitis, unspecified: Secondary | ICD-10-CM | POA: Diagnosis not present

## 2024-10-13 DIAGNOSIS — R52 Pain, unspecified: Secondary | ICD-10-CM

## 2024-10-13 NOTE — Patient Instructions (Signed)
 You are negative for COVID and flu today.  I suspect you have a viral illness  As we discussed, treat your symptoms and follow-up if you are getting severely worse or not improving in the next 5 to 7 days.

## 2024-10-13 NOTE — Progress Notes (Signed)
 Subjective:    Discussed the use of AI scribe software for clinical note transcription with the patient, who gave verbal consent to proceed.  History of Present Illness Summer Arnold is a 38 year old female with asthma who presents with sore throat, feverish feeling, and congestion for five days.  Upper respiratory symptoms - Sore throat for five days, causing difficulty swallowing - Congestion worsened today - Feverish feeling - Body aches and mild headache, improved with medication - Motrin provides partial relief of symptoms - No cough with phlegm  Asthma and respiratory status - History of asthma - Shortness of breath over the past few days - Uses albuterol  inhaler as needed     ROS as in subjective.   Objective: Vitals:   10/13/24 1617  BP: 118/78  Pulse: (!) 108  Temp: 98.8 F (37.1 C)  SpO2: 98%    General appearance: Alert, WD/WN, no distress, mildly ill appearing                             Skin: warm, no rash                           Head: no sinus tenderness                            Eyes: conjunctiva normal, corneas clear, PERRLA                            Ears: pearly TMs, external ear canals normal                          Nose: septum midline, turbinates swollen, with erythema              Mouth/throat: MMM, tongue normal, mild pharyngeal erythema                           Neck: supple, no adenopathy, no thyromegaly, nontender                          Heart: RRR                         Lungs: CTA bilaterally, no wheezes, rales, or rhonchi      Assessment/Plan:  Assessment and Plan Assessment & Plan Acute upper respiratory infection Symptoms include sore throat, feverish feeling, congestion, body aches, and headache. Negative COVID and flu tests, but early in the course of illness. Likely viral etiology, possibly contracted from child care setting. Symptoms expected to resolve in 5-7 days. - Advised symptomatic treatment with Mucinex, Flonase,  Tylenol , or ibuprofen as needed. - Encouraged hydration and rest. - Recommended salt water gargles for sore throat. - Provided out-of-work note for tomorrow.  Asthma Well-controlled with recent shortness of breath over the past few days. No wheezing on examination. Continues to use albuterol  inhaler as needed. - Continue using albuterol  inhaler as needed.

## 2024-10-18 ENCOUNTER — Ambulatory Visit: Admitting: Physician Assistant

## 2024-12-05 ENCOUNTER — Telehealth: Payer: Self-pay

## 2024-12-06 ENCOUNTER — Encounter: Admitting: Physician Assistant
